# Patient Record
Sex: Male | Born: 1935 | Race: White | Hispanic: No | Marital: Married
Health system: Southern US, Community
[De-identification: ages and names within clinical notes are randomized; demographics above are authoritative.]

## PROBLEM LIST (undated history)

## (undated) DIAGNOSIS — Z950 Presence of cardiac pacemaker: Secondary | ICD-10-CM

## (undated) DIAGNOSIS — Z9289 Personal history of other medical treatment: Secondary | ICD-10-CM

## (undated) DIAGNOSIS — Z9889 Other specified postprocedural states: Secondary | ICD-10-CM

## (undated) HISTORY — DX: Presence of cardiac pacemaker: Z95.0

## (undated) HISTORY — DX: Personal history of other medical treatment: Z92.89

## (undated) HISTORY — DX: Other specified postprocedural states: Z98.890

---

## 1998-07-19 ENCOUNTER — Inpatient Hospital Stay (HOSPITAL_COMMUNITY): Admission: RE | Admit: 1998-07-19 | Discharge: 1998-07-21 | Payer: Self-pay | Admitting: *Deleted

## 1998-10-08 ENCOUNTER — Inpatient Hospital Stay (HOSPITAL_COMMUNITY): Admission: RE | Admit: 1998-10-08 | Discharge: 1998-10-09 | Payer: Self-pay | Admitting: Cardiovascular Disease

## 2004-11-03 ENCOUNTER — Inpatient Hospital Stay (HOSPITAL_COMMUNITY): Admission: AD | Admit: 2004-11-03 | Discharge: 2004-11-12 | Payer: Self-pay | Admitting: Cardiovascular Disease

## 2004-11-04 DIAGNOSIS — Z9889 Other specified postprocedural states: Secondary | ICD-10-CM

## 2004-11-04 HISTORY — DX: Other specified postprocedural states: Z98.890

## 2004-11-26 ENCOUNTER — Inpatient Hospital Stay (HOSPITAL_COMMUNITY): Admission: EM | Admit: 2004-11-26 | Discharge: 2004-12-09 | Payer: Self-pay | Admitting: Emergency Medicine

## 2005-01-04 DIAGNOSIS — Z9289 Personal history of other medical treatment: Secondary | ICD-10-CM

## 2005-01-04 HISTORY — DX: Personal history of other medical treatment: Z92.89

## 2005-01-17 ENCOUNTER — Inpatient Hospital Stay (HOSPITAL_COMMUNITY): Admission: EM | Admit: 2005-01-17 | Discharge: 2005-01-20 | Payer: Self-pay | Admitting: *Deleted

## 2005-10-18 ENCOUNTER — Ambulatory Visit (HOSPITAL_COMMUNITY): Admission: RE | Admit: 2005-10-18 | Discharge: 2005-10-18 | Payer: Self-pay | Admitting: *Deleted

## 2007-06-20 ENCOUNTER — Inpatient Hospital Stay (HOSPITAL_COMMUNITY): Admission: EM | Admit: 2007-06-20 | Discharge: 2007-06-28 | Payer: Self-pay | Admitting: Emergency Medicine

## 2007-07-05 ENCOUNTER — Ambulatory Visit: Payer: Self-pay | Admitting: Internal Medicine

## 2009-01-09 ENCOUNTER — Inpatient Hospital Stay (HOSPITAL_COMMUNITY): Admission: EM | Admit: 2009-01-09 | Discharge: 2009-01-15 | Payer: Self-pay | Admitting: Cardiology

## 2010-07-03 IMAGING — CR DG CHEST 2V
2 series · 2 of 2 positions shown · non-contrast
Comparison: 06/25/2007 chest radiographs.

CLINICAL DATA: Chest wall abscess.  Diabetes.

CHEST - 2 VIEW

[w chest pa]
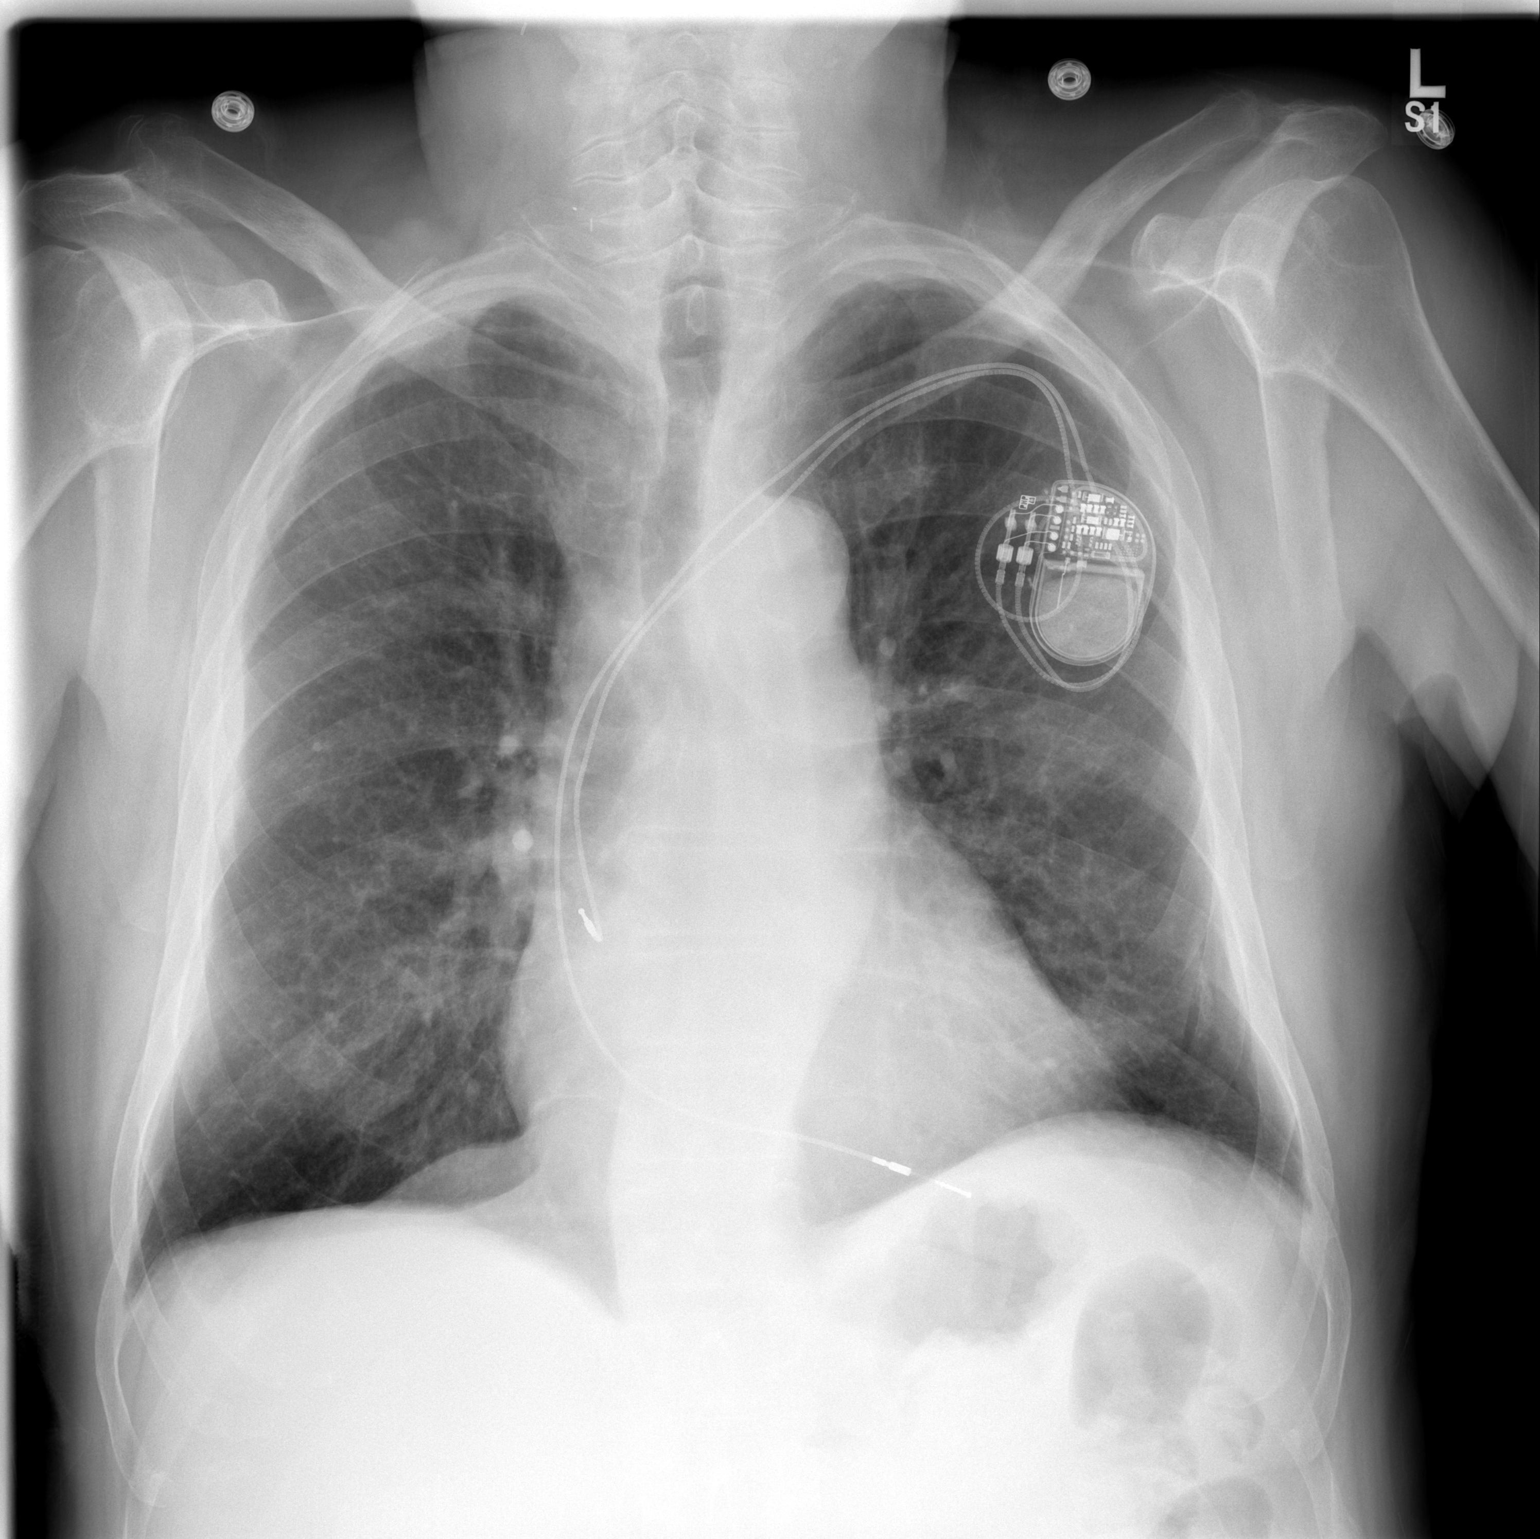

[w chest lat]
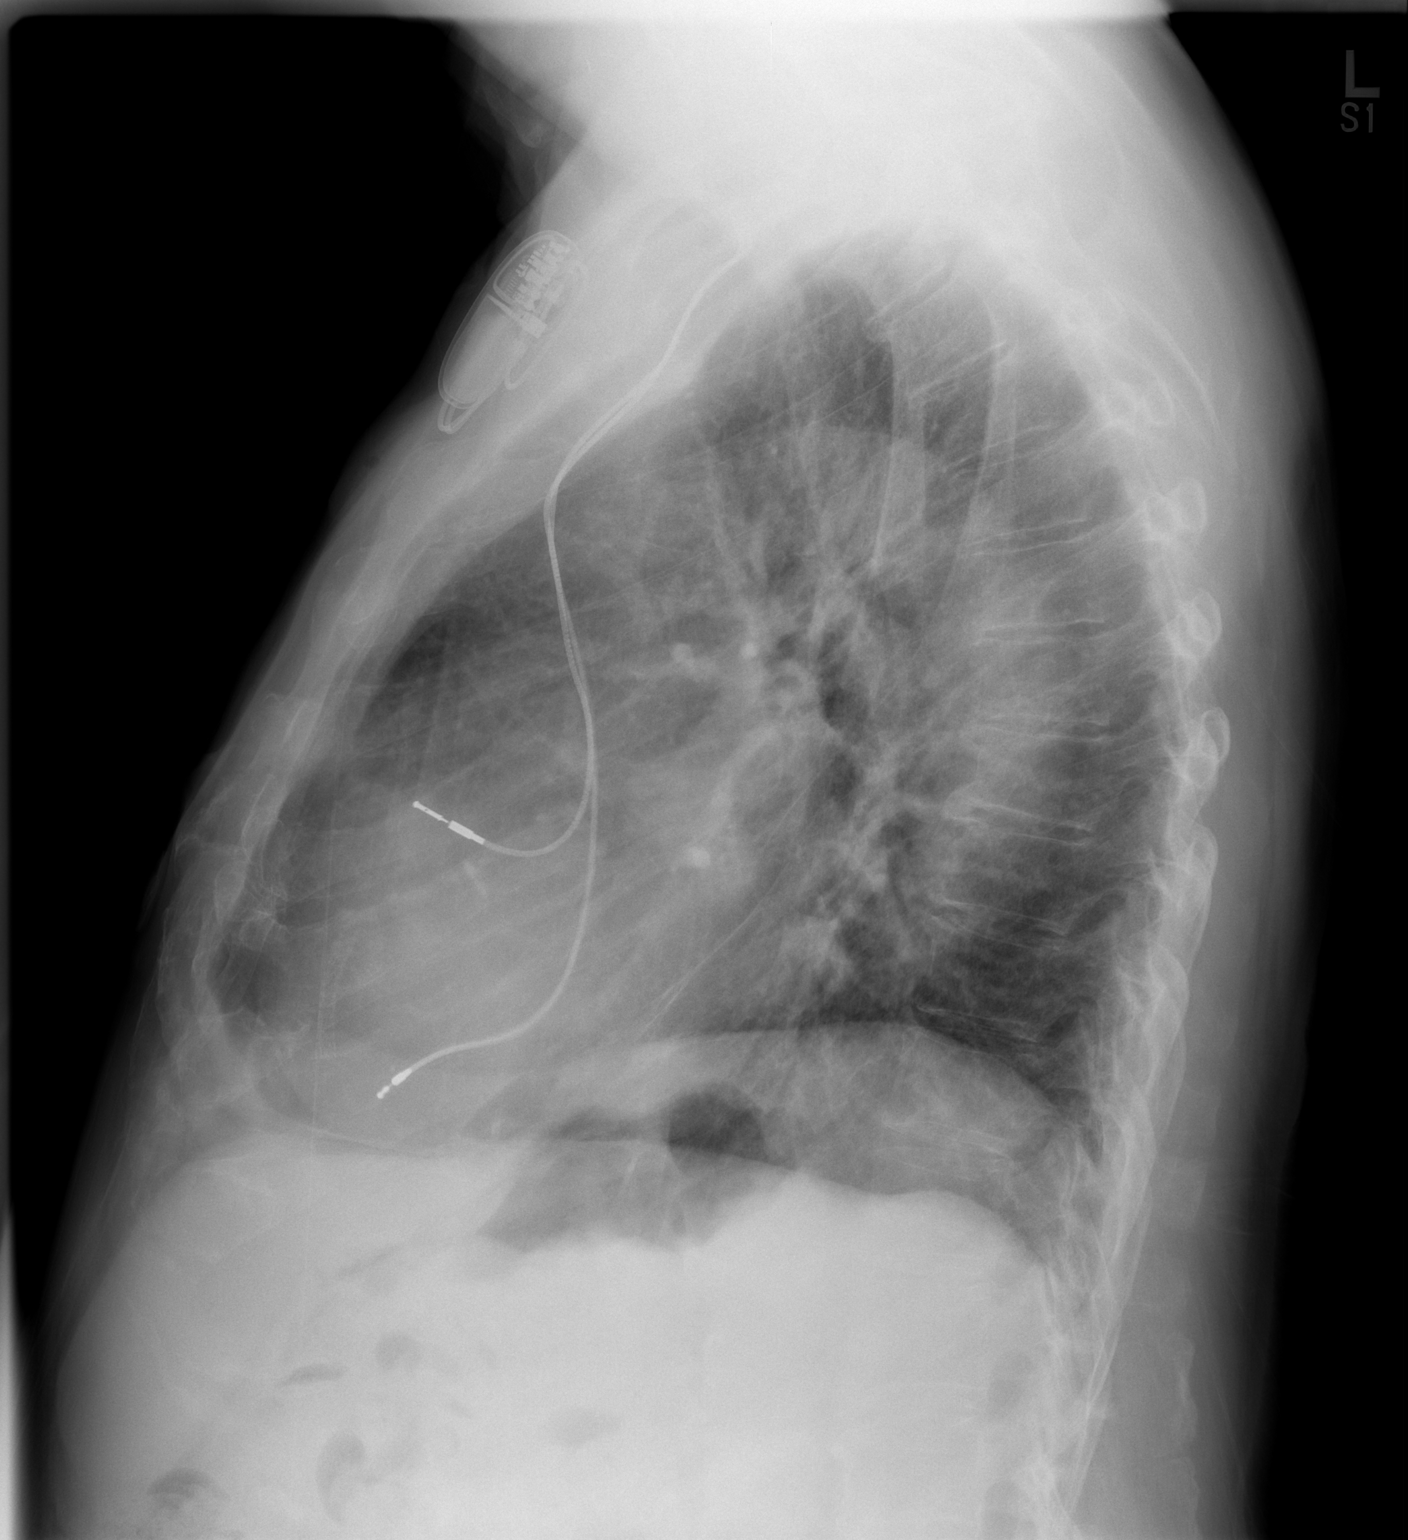

[2 of 2 positions shown; findings below may reference images not displayed]

FINDINGS: The heart size and mediastinal contours are stable.
There is mild aortic tortuosity.  A left subclavian AV sequential
pacemaker appears stable.  The ventricular lead appears partially
severed near the generator, but no discontinuity is identified.
The lungs are clear.  There is no pleural effusion or pneumothorax.
IMPRESSION: 1.  The ventricular lead of the patient's pacemaker appears
partially severed near the generator.
2.  No acute cardiopulmonary process demonstrated.
[DATE] be helpful to evaluate for chest wall abscess if
clinically warranted.

## 2011-04-10 LAB — COMPREHENSIVE METABOLIC PANEL
ALT: 15 U/L (ref 0–53)
AST: 24 U/L (ref 0–37)
Albumin: 3.4 g/dL — ABNORMAL LOW (ref 3.5–5.2)
Alkaline Phosphatase: 40 U/L (ref 39–117)
BUN: 23 mg/dL (ref 6–23)
CO2: 25 mEq/L (ref 19–32)
Calcium: 8.6 mg/dL (ref 8.4–10.5)
Chloride: 95 mEq/L — ABNORMAL LOW (ref 96–112)
Creatinine, Ser: 1.5 mg/dL (ref 0.4–1.5)
GFR calc Af Amer: 54 mL/min — ABNORMAL LOW (ref >60)
GFR calc non Af Amer: 45 mL/min — ABNORMAL LOW (ref >60)
Glucose, Bld: 331 mg/dL — ABNORMAL HIGH (ref 70–99)
Potassium: 3.9 mEq/L (ref 3.5–5.1)
Sodium: 131 mEq/L — ABNORMAL LOW (ref 135–145)
Total Bilirubin: 0.7 mg/dL (ref 0.3–1.2)
Total Protein: 6.9 g/dL (ref 6.0–8.3)

## 2011-04-10 LAB — BASIC METABOLIC PANEL
BUN: 23 mg/dL (ref 6–23)
CO2: 27 mEq/L (ref 19–32)
CO2: 29 mEq/L (ref 19–32)
CO2: 29 mEq/L (ref 19–32)
Calcium: 9 mg/dL (ref 8.4–10.5)
Calcium: 9.2 mg/dL (ref 8.4–10.5)
Chloride: 94 mEq/L — ABNORMAL LOW (ref 96–112)
Chloride: 97 mEq/L (ref 96–112)
Chloride: 98 mEq/L (ref 96–112)
Creatinine, Ser: 1.52 mg/dL — ABNORMAL HIGH (ref 0.4–1.5)
Creatinine, Ser: 1.59 mg/dL — ABNORMAL HIGH (ref 0.4–1.5)
GFR calc Af Amer: 50 mL/min — ABNORMAL LOW (ref >60)
GFR calc Af Amer: 51 mL/min — ABNORMAL LOW (ref >60)
GFR calc Af Amer: >60 mL/min (ref >60)
GFR calc non Af Amer: 42 mL/min — ABNORMAL LOW (ref >60)
GFR calc non Af Amer: 44 mL/min — ABNORMAL LOW (ref >60)
Glucose, Bld: 238 mg/dL — ABNORMAL HIGH (ref 70–99)
Glucose, Bld: 248 mg/dL — ABNORMAL HIGH (ref 70–99)
Potassium: 4 mEq/L (ref 3.5–5.1)
Sodium: 136 mEq/L (ref 135–145)
Sodium: 136 mEq/L (ref 135–145)
Sodium: 136 mEq/L (ref 135–145)

## 2011-04-10 LAB — CULTURE, ROUTINE-ABSCESS: Culture: NO GROWTH

## 2011-04-10 LAB — CBC
HCT: 29.8 % — ABNORMAL LOW (ref 39.0–52.0)
HCT: 30.1 % — ABNORMAL LOW (ref 39.0–52.0)
HCT: 32 % — ABNORMAL LOW (ref 39.0–52.0)
Hemoglobin: 10.6 g/dL — ABNORMAL LOW (ref 13.0–17.0)
Hemoglobin: 9.9 g/dL — ABNORMAL LOW (ref 13.0–17.0)
Hemoglobin: 9.9 g/dL — ABNORMAL LOW (ref 13.0–17.0)
MCHC: 33 g/dL (ref 30.0–36.0)
MCHC: 33 g/dL (ref 30.0–36.0)
MCHC: 33 g/dL (ref 30.0–36.0)
MCHC: 33.8 g/dL (ref 30.0–36.0)
MCV: 90.7 fL (ref 78.0–100.0)
MCV: 91 fL (ref 78.0–100.0)
MCV: 91.4 fL (ref 78.0–100.0)
MCV: 91.7 fL (ref 78.0–100.0)
MCV: 91.9 fL (ref 78.0–100.0)
MCV: 92.3 fL (ref 78.0–100.0)
Platelets: 258 10*3/uL (ref 150–400)
Platelets: 260 10*3/uL (ref 150–400)
Platelets: 266 10*3/uL (ref 150–400)
Platelets: 274 10*3/uL (ref 150–400)
Platelets: 288 10*3/uL (ref 150–400)
RBC: 3.25 MIL/uL — ABNORMAL LOW (ref 4.22–5.81)
RBC: 3.27 MIL/uL — ABNORMAL LOW (ref 4.22–5.81)
RBC: 3.4 MIL/uL — ABNORMAL LOW (ref 4.22–5.81)
RBC: 3.49 MIL/uL — ABNORMAL LOW (ref 4.22–5.81)
RDW: 14.7 % (ref 11.5–15.5)
RDW: 14.8 % (ref 11.5–15.5)
RDW: 14.9 % (ref 11.5–15.5)
RDW: 14.9 % (ref 11.5–15.5)
RDW: 15.2 % (ref 11.5–15.5)
WBC: 7.4 10*3/uL (ref 4.0–10.5)
WBC: 7.5 10*3/uL (ref 4.0–10.5)
WBC: 7.5 10*3/uL (ref 4.0–10.5)
WBC: 7.6 10*3/uL (ref 4.0–10.5)
WBC: 9.1 10*3/uL (ref 4.0–10.5)

## 2011-04-10 LAB — GLUCOSE, CAPILLARY
Glucose-Capillary: 169 mg/dL — ABNORMAL HIGH (ref 70–99)
Glucose-Capillary: 195 mg/dL — ABNORMAL HIGH (ref 70–99)
Glucose-Capillary: 208 mg/dL — ABNORMAL HIGH (ref 70–99)
Glucose-Capillary: 224 mg/dL — ABNORMAL HIGH (ref 70–99)
Glucose-Capillary: 231 mg/dL — ABNORMAL HIGH (ref 70–99)
Glucose-Capillary: 283 mg/dL — ABNORMAL HIGH (ref 70–99)
Glucose-Capillary: 311 mg/dL — ABNORMAL HIGH (ref 70–99)
Glucose-Capillary: 314 mg/dL — ABNORMAL HIGH (ref 70–99)
Glucose-Capillary: 338 mg/dL — ABNORMAL HIGH (ref 70–99)
Glucose-Capillary: 358 mg/dL — ABNORMAL HIGH (ref 70–99)
Glucose-Capillary: 370 mg/dL — ABNORMAL HIGH (ref 70–99)

## 2011-04-10 LAB — URINALYSIS, ROUTINE W REFLEX MICROSCOPIC
Bilirubin Urine: NEGATIVE
Glucose, UA: >1000 mg/dL — AB
Hgb urine dipstick: NEGATIVE
Ketones, ur: NEGATIVE mg/dL
Leukocytes, UA: NEGATIVE
Nitrite: NEGATIVE
Protein, ur: NEGATIVE mg/dL
Specific Gravity, Urine: 1.021 (ref 1.005–1.030)
Urobilinogen, UA: 1 mg/dL (ref 0.0–1.0)
pH: 6.5 (ref 5.0–8.0)

## 2011-04-10 LAB — DIFFERENTIAL
Basophils Absolute: 0 10*3/uL (ref 0.0–0.1)
Basophils Relative: 0 % (ref 0–1)
Eosinophils Absolute: 0.1 10*3/uL (ref 0.0–0.7)
Eosinophils Relative: 1 % (ref 0–5)
Lymphocytes Relative: 10 % — ABNORMAL LOW (ref 12–46)
Lymphs Abs: 0.9 10*3/uL (ref 0.7–4.0)
Monocytes Absolute: 0.8 10*3/uL (ref 0.1–1.0)
Monocytes Relative: 9 % (ref 3–12)
Neutro Abs: 7.2 10*3/uL (ref 1.7–7.7)
Neutrophils Relative %: 80 % — ABNORMAL HIGH (ref 43–77)

## 2011-04-10 LAB — CULTURE, BLOOD (ROUTINE X 2)
Culture: NO GROWTH
Culture: NO GROWTH

## 2011-04-10 LAB — PROTIME-INR
INR: 1.3 (ref 0.00–1.49)
INR: 1.5 (ref 0.00–1.49)
INR: 1.6 — ABNORMAL HIGH (ref 0.00–1.49)
INR: 1.6 — ABNORMAL HIGH (ref 0.00–1.49)
Prothrombin Time: 17.1 seconds — ABNORMAL HIGH (ref 11.6–15.2)
Prothrombin Time: 17.3 seconds — ABNORMAL HIGH (ref 11.6–15.2)
Prothrombin Time: 18.3 seconds — ABNORMAL HIGH (ref 11.6–15.2)
Prothrombin Time: 19.5 seconds — ABNORMAL HIGH (ref 11.6–15.2)
Prothrombin Time: 19.6 seconds — ABNORMAL HIGH (ref 11.6–15.2)

## 2011-04-10 LAB — URINE MICROSCOPIC-ADD ON

## 2011-04-10 LAB — HEPARIN LEVEL (UNFRACTIONATED)
Heparin Unfractionated: 0.2 IU/mL — ABNORMAL LOW (ref 0.30–0.70)
Heparin Unfractionated: 0.3 IU/mL (ref 0.30–0.70)

## 2011-04-10 LAB — FOLATE RBC: RBC Folate: 2267 ng/mL — ABNORMAL HIGH (ref 180–600)

## 2011-04-10 LAB — HEMOGLOBIN A1C
Hgb A1c MFr Bld: 9.2 % — ABNORMAL HIGH (ref 4.6–6.1)
Mean Plasma Glucose: 217 mg/dL

## 2011-04-10 LAB — TSH: TSH: 2.484 u[IU]/mL (ref 0.350–4.500)

## 2011-05-09 NOTE — Discharge Summary (Signed)
NAME:  Matthew Lloyd, Matthew Lloyd NO.:  [id number]   MEDICAL RECORD NO.:  [id number]          PATIENT TYPE:  INP   LOCATION:  2022                         FACILITY:  [facility]   PHYSICIAN:  Madaline Savage, M.D.DATE OF BIRTH:  1926/08/15   DATE OF ADMISSION:  01/09/2009  DATE OF DISCHARGE:  01/15/2009                               DISCHARGE SUMMARY   DISCHARGE DIAGNOSES:  1. Infected sebaceous cyst, left chest, status post incision and      drainage as an outpatient at Urgent Care with a drain placement.  2. History of a permanent pacemaker implant, January 2006 for      paroxysmal atrial fibrillation and bradycardia.  3. Insulin-dependent diabetes.  4. Coumadin therapy, Lovenox to Coumadin crossover at discharge.  5. Coronary artery disease with right coronary artery Cypher stenting      in 2005.  6. Peripheral vascular disease with prior right iliac artery      percutaneous transluminal angioplasty and prior carotid      endarterectomy.  7. History of anemia, the patient did have a transfusion in July 2008.      He has had a Gastrointestinal workup in December 2005 and again in      July 2008.  8. Gastroesophageal reflux and hiatal hernia.  9. Chronic obstructive pulmonary disease.   HOSPITAL COURSE:  The patient is an 77 year old male followed by Dr.  Lucila Maine in [place].  His cardiologist is Dr. Allyson Sabal.  He has a  pacemaker implanted by Dr. Jenne Campus in January 2006.  He has been on  chronic Coumadin.  His pacemaker is a Medtronic device.  Apparently as  an outpatient he developed a blackhead on his left chest wall.  The  patient tried to take care of this himself.  His wife noted a few days  prior to admission that he had a large boil at the site.  She took him  to an Urgent Care where this was opened and a drain placed.  It was then  noted that the site was about an inch and half below his pacemaker.  He  was transferred to [facility] for further treatment.  He was  admitted by Dr.  Elsie Lincoln.  We put him on IV antibiotics.  His initial chest x-ray  questioned possible lead fracture, but his pacemaker was interrogated  and did not show a lead fracture and that the patient was functioning  normally.  The patient was seen in consult by the Surgical Service, Dr.  Abbey Chatters.  Dr. Abbey Chatters feels that the patient most likely had an  infected sebaceous cyst.  He did a wider I and D and debridement at the  bedside.  He feels the patient should be continued with b.i.d. dressing  changes at home.  His Coumadin had been held on admission and then the  patient was put on heparin.  Blood cultures and wound culture showed no  growth.  Lovenox was added.  We feel he can be crossed over as an  outpatient to Coumadin.  On January 15, 2009, his INR was 1.4.  We will  given him an extra dose of Lovenox today and have him follow up Monday  in the office with a protime and a wound site check.  I discussed all  this with the patient's wife and she understands.  She will be giving  his injections.  Home health nurse will be doing the wound changes.   DISCHARGE MEDICATIONS:  1. TriCor 145 daily.  2. Lantus 40 units at bedtime.  3. Protonix 40 mg a day.  4. Lasix 40 mg twice a day.  5. Metformin 500 mg b.i.d.  6. Coreg 25 mg b.i.d.  7. Aspirin 81 mg a day.  8. Colace 100 mg a day.  9. Multivitamin daily.  10.Digoxin 0.125 mg a day.  11.Flomax 0.4 mg a day.  12.Finasteride 5 mg daily.  13.Coumadin 6 mg a day with 3 mg on Mondays and Fridays.  14.Lipitor 20 mg a day.  15.Keflex 500 mg t.i.d.  16.Lovenox 80 mg subcu q.12.   LABORATORIES AT DISCHARGE:  White count 7.9, hemoglobin 10.4, hematocrit  30.8, and platelets 302.  INR 1.4.  Sodium 134, potassium 3.8, BUN 25,  creatinine and 1.59.  Hemoglobin A1c is 9.2 on admission.  Folate 2267  and B12 of 326.  Blood cultures showed no growth x5 days.  Wound culture  shows no growth.  Chest x-ray 2-view shows ventricular  lead.  The  patient appears to be partially severed near the generator, no acute  process, pacemaker check indicated.  The pacemaker was functioning  normally and that there was no lead fracture.   DISPOSITION:  The patient is discharged in stable condition.  He will  have a protime and a site check on Monday.  He probably should have his  wound check every 3 days or so.  He will follow up with Dr. Abbey Chatters  in a couple weeks.  Home health nurse has been arranged to give the  patient b.i.d. dressing changes and repacking.      Abelino Derrick, P.A.    ______________________________  Madaline Savage, M.D.    Lenard Lance  D:  01/15/2009  T:  01/16/2009  Job:  [id number]   cc:   Madaline Savage, M.D.

## 2011-05-09 NOTE — H&P (Signed)
NAME:  Matthew Lloyd, Matthew Lloyd NO.:  [id number]   MEDICAL RECORD NO.:  [id number]          PATIENT TYPE:  INP   LOCATION:  2022                         FACILITY:  [facility]   PHYSICIAN:  Madaline Savage, M.D.DATE OF BIRTH:  1926-05-04   DATE OF ADMISSION:  01/09/2009  DATE OF DISCHARGE:                              HISTORY & PHYSICAL   HISTORY OF PRESENT ILLNESS:  Mr. Matthew Lloyd is an 77 year old male followed  by Dr. Lucila Maine in [place].  His cardiologist is Dr. Allyson Sabal.  He has  a history of remote peripheral vascular disease with prior right iliac  artery PTA.  He also has coronary disease and had an RCA Cypher stent  placed in 2005.  At that time he had a residual 70% circumflex stenosis  that has been treated medically.  His last ejection fraction was 45-50%.  He has PAF and sick sinus syndrome and had a Medtronic pacemaker  implanted by Dr. Jenne Campus in January 2006.  He is on chronic Coumadin.  He is followed at our clinic.  He had a pacer check a couple weeks ago  and was told this was okay.  The patient says a few weeks ago he  developed a blackhead  just below his pacemaker site.  He tried to pop  it.  This gradually became more inflamed until he had apparently a fair  sized abscess at the site.  His wife was unaware of this until couple  days ago.  She brought him to Urgent Care today.  At Urgent Care this  site was looked at and I and D's and a drain put in and the patient is  now admitted for further treatment here to [facility].  The patient  denies any systemic complaints such as fever or chills.  He has  otherwise been doing well.   CURRENT MEDICATIONS:  1. Coumadin 3 mg on Monday and Friday and 6 mg other days.  2. Protonix 40 mg a day.  3. Lasix 40 mg p.o. b.i.d.  4. Metformin 500 mg b.i.d.  5. Coreg 25 mg b.i.d.  6. Aspirin 81 mg a day.  7. Multivitamin daily.  8. Tricor 145 daily.  9. Lantus 30 units q.p.m.  10.Lipitor 20 mg a day.   ALLERGIES:   Denies any drug allergies.   SOCIAL HISTORY:  He is married.  He is a nonsmoker.  He lives with his  wife.   FAMILY HISTORY:  Remarkable as father died of heart failure  complications at 5.  His mother died at 4.   PAST MEDICAL HISTORY:  1. His past medical history is otherwise remarkable for insulin-      dependent diabetes.  2. He also has a history of BPH.  3. He has treated dyslipidemia.  4. He has a history of chronic anemia and was evaluated in 2005 with      an endoscopy and colonoscopy and again was noted to have low B12      levels and high MCV in July 2008.  5. During that admission, he was admitted for hypotension.  He was      seen by Dr. Marina Goodell in July 2008.  6. He has a history of gastroesophageal reflux disease.  7. History of hiatal hernia.  8. COPD.   REVIEW OF SYSTEMS:  Essentially unremarkable except for noted above.  He  denies any he has had melena.  He denies any fever or chills.  He is  hard of hearing and has hearing aid.   PHYSICAL EXAMINATION:  VITAL SIGNS:  Pulse is 70, respirations 12, blood  pressure will 110/74.  GENERAL:  He is a well-developed, well-nourished male in no acute  distress.  HEENT:  Normocephalic. He has bilateral hearing aids.  He has poor  dentition.  NECK:  Without JVD or bruit.  CHEST:  Clear to auscultation and percussion.  Anterior chest wall shows  an erythematous area below his pacemaker site.  He has a wick extending  from an open wound that is covered with a 4x4.  CARDIAC:  Regular rate and rhythm without obvious murmur or rub.  ABDOMEN:  Obese.  She has a ventral hernia that is nontender.  EXTREMITIES:  Revealed no edema and distal pulses are diminished  bilaterally.  I could ascertain no femoral bruits.  NEURO:  Grossly intact.  He is awake, alert, oriented and cooperative.   LABORATORY DATA:  Labs and EKG are pending.   IMPRESSION:  1. Superficial infection, incised and drained.  The site is just below      his  pacemaker.  2. History of Medtronic pacemaker implant January 2006 for paroxysmal      atrial fibrillation and bradycardia.  3. Treated hypertension.  4. Insulin-dependent diabetes.  5. Coronary disease with right coronary artery Cypher stent in 2005      with residual 70% circumflex.  6. Peripheral vascular disease with prior right iliac artery      angioplasty and history of prior carotid endarterectomy.  7. Coumadin therapy.  8. History of anemia.  The patient was transfused in July 2008.  He      had a gastrointestinal workup in December 2005 and was seen again      and noted to be B12 deficient July 2008.  9. Esophageal reflux and hiatal hernia.  10.Chronic obstructive pulmonary disease.   PLAN:  The patient is to be admitted to telemetry.  Will start him on IV  Ancef.  We will go ahead and check cultures including a wound culture  and blood cultures.      Abelino Derrick, P.A.    ______________________________  Madaline Savage, M.D.    Lenard Lance  D:  01/09/2009  T:  01/09/2009  Job:  [id number]

## 2011-05-09 NOTE — Discharge Summary (Signed)
NAME:  Matthew Lloyd, Matthew Lloyd NO.:  [id number]   MEDICAL RECORD NO.:  [id number]          PATIENT TYPE:  INP   LOCATION:  4715                         FACILITY:  [facility]   PHYSICIAN:  Madaline Savage, M.D.DATE OF BIRTH:  Jan 02, 1926   DATE OF ADMISSION:  06/20/2007  DATE OF DISCHARGE:  06/28/2007                               DISCHARGE SUMMARY   Mr. Gaelan Glennon is a 77 year old male patient of Dr. Nanetta Batty  who came to the hospital with hypotension.  He was seen by Dr. Julieanne Manson.  Multiple medications were held and his Lopressor was decreased.  He was also found to be Dig toxic.  His BNP was in the 700s.  His blood  pressure was low so his diuretic was held.  His Coumadin also was  initially held.  Dig level the following day, June 21, 2007, was 3.8.  His medications continued to be held.  On June 21, 2007, he had  increased confusion.  His blood pressure dropped.  He was started on IV  dopamine for blood pressure of 76/38.  He was moved to the unit. On June 22, 2007, his IV dopamine was weaned.  It was found that he was vitamin  B12 deficient.  He was also anemic.  He had a high MCV.  His replacement  with vitamin B12 was started on June 23, 2007.  He received 1000 mcg a  day for 7 days and will need further loading at his physician's office  of 1000 mcg once a week and then once a month.  His hemoglobin did drop.  His guaiac stool was negative.  GI consult was called.  He received 1  unit of packed red blood cells on June 27, 2007, for hemoglobin of 7.4.  It then increased to 9.3 and the following day it dropped down to 8.3.  His admitting hemoglobin was 8.9.  He was seen by Dr. Yancey Flemings who  recommended continued B12 replacement and that he should be on proton  pump inhibitor prophylactically because of his age and because of his  need for anticoagulation.  No further GI tests were thought needed at  this time.  On June 28, 2007, he was seen by Dr. Elsie Lincoln  and considered  stable to be discharged home.  The patient apparently really wanted to  go home.  His wife thought that she could handle him.  He was seen by  cardiac rehab and walked.  He does use a walker at home and he is fairly  sedentary apparently.  We did order home health and PT evaluation for  home at his discharge.   DISCHARGE MEDICATIONS:  1. Proscar 5 mg a day.  2. Avandia 8 mg a day.  3. Tricor 145 mg a day.  4. Flomax 0.4 mg a day.  5. Lipitor 20 mg a day.  6. Lantus insulin 5 units at bedtime or his sliding scale as usual.  7. Metoprolol 25 mg twice a day.  8. Lasix 40 mg a day.  9. Colace 200 mg a day.  10.Protonix  40 mg a day or over-the-counter Prilosec 20 mg a day.  11.Coumadin 1 mg a day.  12.Aspirin 81 mg a day.  13.Vitamin B12 injections 1000 mcg weekly x1 month and then 1000 mcg a      day of vitamin B12 daily.  14.Fortamet 1 gram at bedtime.  15.He should stop his Digoxin, Cartia XT, and his Altace.   Also to note, he apparently drinks wine.  He was told that he could only  have 1 glass of wine a night.  This may be a problem that will need to  be assessed as an outpatient.  It was recommended that he checked his  prothrombin time and a CBC on Tuesday.  He should see  Dr. Lorin Picket this  coming week and see Dr. Allyson Sabal in 1 to 2 weeks.  He will need close  followup.  If his hemoglobin drops again, the risk-benefit ratio may  have worsened now to the point that he is not a Coumadin candidate.  This will need to be assessed as an outpatient.  His Plavix was stopped.  He had a Cypher stent placed in 2005.   LABS:  His labs on June 28, 2007, revealed BNP was 783.  Sodium was 136,  potassium 3.9, chloride 105, CO2 28, glucose 110, BUN 31, and  creatinine was 1.18.  INR was 1.3.  CBC showed a hemoglobin of 8.9,  hematocrit 25.4, WBC 5.2 and platelets 214.  BNP on June 25, 2007,  was  1097.  Dig level on June 24, 2007, was 1.  Total binding iron was 256,  iron was 54,  and folate was 2.1.  Vitamin B12 was only 128.  CEA was  1.3.  Retic percent was 1.1.  Hemoglobin A1c was 5.7.  Alcohol level on  admission was less than 5.  Dig level on admission was 4.8.   DISCHARGE DIAGNOSES:  1. Hypotension.  2. Dig toxicity.  3. Anemia.  Vitamin B12 deficient, now getting replacement.  4. Paroxysmal atrial fibrillation.  5. __________.  6. Insulin-dependent diabetes.  7. Sedentary.  He uses a walker at home.  8. Ejection fraction of 45% to 50%.  9. Benign prostatic hypertrophy.  10.Dyslipidemia.  11.Coronary artery disease with history of Cypher stent in 2005 to his      right coronary artery with residual disease in the circumflex of      70%.      Lezlie Octave, N.P.    ______________________________  Madaline Savage, M.D.    BB/MEDQ  D:  06/28/2007  T:  06/28/2007  Job:  [id number]   cc:   Lucila Maine, MD

## 2011-05-09 NOTE — Op Note (Signed)
NAME:  COE, ANGELOS NO.:  [id number]   MEDICAL RECORD NO.:  [id number]          PATIENT TYPE:  INP   LOCATION:  2022                         FACILITY:  [facility]   PHYSICIAN:  Adolph Pollack, M.D.DATE OF BIRTH:  [birth year]   DATE OF PROCEDURE:  01/11/2009  DATE OF DISCHARGE:                               OPERATIVE REPORT   PREOPERATIVE DIAGNOSIS:  Incompletely drained left chest wall abscess.   POSTOPERATIVE DIAGNOSIS:  Incompletely drained left chest wall abscess.   PROCEDURE:  Incision, drainage, and debridement of left chest wall  abscess.   SURGEON:  Adolph Pollack, MD   ANESTHESIA:  Lidocaine, local.   TECHNIQUE:  The area in the left chest wall which was inferior to the  pacemaker site was sterilely prepped and draped and anesthetized with  lidocaine solution.  An elliptical incision was made debriding some of  the dead skin over the area.  Purulent debris which appeared to be  consistent with sebaceous cyst content was removed as well as some of  the cystic capsule.  This all was taken down to good bleeding tissue.  The wound was then packed and dressed.  He tolerated the procedure well.      Adolph Pollack, M.D.  Electronically Signed     TJR/MEDQ  D:  01/11/2009  T:  01/12/2009  Job:  [id number]

## 2011-05-09 NOTE — Consult Note (Signed)
NAME:  Matthew Lloyd, UREY NO.:  [id number]   MEDICAL RECORD NO.:  [id number]          PATIENT TYPE:  INP   LOCATION:  2022                         FACILITY:  [facility]   PHYSICIAN:  Adolph Pollack, M.D.DATE OF BIRTH:  06-26-1926   DATE OF CONSULTATION:  01/11/2009  DATE OF DISCHARGE:                                 CONSULTATION   TIME OF CONSULTATION:  14:10 p.m.   REQUESTING PHYSICIAN:  Dr. Gaspar Garbe B. Little.   PRIMARY CARE PHYSICIAN:  Dr. Lucila Maine in [place].   CARDIOLOGIST:  Dr. Nanetta Batty.   REASON FOR CONSULTATION:  Abscess on chest just below pacemaker.   HISTORY OF PRESENT ILLNESS:  Mr. Bink is an 77 year old white male who  has multiple medical problems including paroxysmal atrial fibrillation  and bradycardia, which resulted in the placement of a Medtronic  pacemaker in January 2006, insulin-dependent diabetes, coronary artery  disease, COPD, peripheral vascular disease, who noticed a blackhead,  which started appearing on his chest approximately 1 month ago.  He  states at this time he squeezed it several times, however, it just  continued to worsen.  At this time, the patient did not mention this to  his wife until his wife actually noticed it on his chest on Saturday.  At this time, she was highly concerned due to the proximity to the  patient's pacemaker.  Only at that time took the patient to an Urgent  Care Clinic.  At the clinic, an I&D of this abscess was performed.  This  area was packed with a wick.  At that time, the patient was sent over  here to [facility] to check with his cardiologist to make sure that this  abscess was not interfering with his pacemaker.  At that time, the  patient was admitted to the hospital for observation.  He denies any  fevers or chills.  At this time, he does not have white blood cell  count.  After this area was I&D'd and packed in place, it has not been  changed since then.  Also, since then, the  patient has not had any  problems with his cardiac rhythm or any other signs of pacemaker  malfunction.  However, due to the proximity of this abscess to the  pacemaker, we were called to check and make sure that no further  treatment of this abscess was needed, and to make sure that the  pacemaker was not involved.   REVIEW OF SYSTEMS:  Please see HPI, otherwise all other systems are  currently negative.   PAST MEDICAL HISTORY:  Significant for:  1. Coronary artery disease.  2. Peripheral vascular disease.  3. Paroxysmal atrial fibrillation for which he has a pacemaker.  4. History of BPH.  5. Dyslipidemia.  6. History of chronic anemia.  7. Hypertension.  8. Chronic obstructive pulmonary disease.   PAST SURGICAL HISTORY:  1. Placement of pacemaker in 2006.  2. Cypher stent placed in 2005.  It does not appear that he has had      any surgery on his abdomen.   SOCIAL  HISTORY:  The patient is married and currently lives with his  wife.  He has 1 daughter.  He quit smoking approximately 30 years ago.  However, he currently still drinks alcohol.  He states that the other  day, just within a day and a half to 2 days, he drank approximately  seven 16-ounce beer.   ALLERGIES:  NKDA.   MEDICATIONS:  1. Coumadin 3 mg on Monday and Friday, 6 mg on other days.  2. Protonix 40 mg a day.  3. Lasix 40 mg p.o. b.i.d.  4. Metformin 500 mg b.i.d.  5. Coreg 25 mg b.i.d.  6. Aspirin 81 mg a day.  7. Multivitamin daily.  8. TriCor 145 mg daily.  9. Lantus 30 units q.p.m.  10.Lipitor 20 mg a day.   PHYSICAL EXAMINATION:  GENERAL:  Mr. Wolman is an 77 year old white male  who was very pleasant, well developed, well nourished, and currently, in  no acute distress.  VITAL SIGNS:  Temperature 97.4, pulse 70, respirations 18, and blood  pressure was 110/58.  HEENT:  Head is normocephalic and atraumatic.  Sclerae noninjected.  Pupils are equal, round, and reactive to light.  It is of note  that the  patient has 1 brown eye and 1 blue eye.  Ears and nose without any  obvious masses or lesions.  No rhinorrhea.  Mouth is pink and moist.  Throat shows no exudate.  NECK:  Supple.  Trachea is midline.  No thyromegaly.  HEART:  Currently paced and has regular rate and rhythm.  No murmurs,  gallops, or rubs are noted.  The patient has good radial, pedal, and  carotid pulses bilaterally.  LUNGS:  Clear to auscultation bilaterally.  No wheezes, rhonchi, or  rales noted.  Respiratory effort is nonlabored.  Chest is symmetrical,  however, he does have a sebaceous cyst noted on his left chest just  inferior to his pacemaker.  He has approximately 3 cm extending outward  of surrounding erythema.  He also has some noted induration around the  opening of this wound.  This wound has been I&D'd and the opening is  approximately 0.5 x 0.5 cm caliber.  Currently, the opening is small  enough and due to the proximity of the pacemaker, we did not want to  explore this cavity to determine its depth until we can better see.  Otherwise, when palpating this area, a solid sebaceous cyst type  drainage was able to be expressed along with some liquid purulent  material.  This was not malodorous.  ABDOMEN:  Soft, nontender, nondistended with active bowel sounds.  He  was obese.  MUSCULOSKELETAL:  All 4 extremities are symmetrical with minimal flowing  noted in his feet.  Otherwise, no cyanosis or clubbing.  NEUROLOGIC:  Cranial nerves II through XII appeared to be grossly  intact.  Deep tendon reflex exam is deferred at this time.  PSYCH:  The patient seems to be alert and oriented x3 with an  appropriate affect.   LABORATORY DATA:  White blood cell count is 7400, hemoglobin 10.0,  hematocrit 30.0, and platelet count is 266,000.  INR is 1.5 and PT is  18.5.  Sodium 131, potassium 3.9, glucose 331, BUN 23, creatinine 1.50.   DIAGNOSTIC DATA:  Chest x-ray from 2 days ago on January 09, 2009, shows   that a ventricular lead appears to be partially severed near the  patient's pacemaker generator.  Otherwise, no acute abnormalities were  seen.   IMPRESSION:  1. Chest wall abscess (probable sebaceous cyst).  2. Pacemaker for paroxysmal atrial fibrillation and bradycardia, which      was placed in 2006.  3. Coronary artery disease.  4. Peripheral vascular disease.  5. Diabetes mellitus.  6. Hypertension.  7. On chronic Coumadin therapy.   PLAN:  At this time, it appears the patient has a chest wall abscess  likely secondary to an infected sebaceous cyst. This wound needs to be  further incised and debrided.  Once this is done, we will better be able  to determine if this infection is affecting the patient's pacemaker.  This has been discussed with the patient as well as his wife, who was in  the room as well, and at this time, they wished to proceed with the  bedside I&D.  Currently, the patient's INR is 1.5, which is suitable for  bedside I&D as well.  Once this wound is opened more, we will also  probably initially need to begin packing of wound 2 times a day with wet-  to-dry dressing changes.  However, we will know more exactly after this  procedure is complete.      Letha Cape, PA      Adolph Pollack, M.D.  Electronically Signed    KEO/MEDQ  D:  01/11/2009  T:  01/12/2009  Job:  [id number]   cc:   Lucila Maine, MD  Nanetta Batty, M.D.  Thereasa Solo. Little, M.D.

## 2011-05-12 NOTE — Discharge Summary (Signed)
NAME:  Matthew Lloyd, DOLECKI NO.:  [id number]   MEDICAL RECORD NO.:  [id number]          PATIENT TYPE:  INP   LOCATION:  2027                         FACILITY:  [facility]   PHYSICIAN:  Nanetta Batty, M.D.   DATE OF BIRTH:  10/26/1926   DATE OF ADMISSION:  11/03/2004  DATE OF DISCHARGE:  11/12/2004                                 DISCHARGE SUMMARY   DISCHARGE DIAGNOSES:  1.  Coronary disease with abnormal Cardiolite study as an outpatient.      Transferred from [facility] for further evaluation.  2.  Coronary disease with elective right coronary artery Cypher stenting      this admission with residual 70% circumflex lesion and an EF of 45-50%      on November 04, 2004.  3.  Previous history of peripheral vascular disease with remote right iliac      stenting by Dr. Nanetta Batty.  4.  Atrial fibrillation, new this admission.  5.  Obesity.  6.  Sleep apnea.  7.  Noninsulin-dependent diabetes mellitus.  8.  Treated hypertension.  9.  Dyslipidemia.  10. Coumadin therapy, new this admission.   HOSPITAL COURSE:  The patient is a 77 year old male with a history of  peripheral vascular disease.  He is followed by Dr. Lorin Picket in [place], [place].  He was referred to [facility] by his primary care  physician, Dr. Lucila Maine, with atrial fibrillation and fatigue.  He was  admitted to [facility] and underwent a Cardiolite on November 03, 2004 which showed an EF of 52% with some inferior ischemia.  The patient was  transferred to [facility] November 03, 2004 and admitted  by Dr. Ulyses Amor.  His enzymes were negative here.   He was admitted to the CCU and started on heparin and nitroglycerin.  His  atrial fibrillation was new and controlled with medications.  He underwent  diagnostic catheterization November 04, 2004 by Dr. Nanetta Batty which  reveals an 80% RCA. He underwent Cypher stenting.  There was a residual 70%  circumflex that Dr. Nanetta Batty would like to treat medically at this  time.  His EF was 45-50%.  He does have an abdominal aortic aneurysm, normal  renal arteries, and a patent right iliac stenting.  The patient was put on  Plavix, low dose aspirin, and started on Coumadin.  His right groin was  stable postoperatively.  We did have some problems with rate control.  At  times, his rate was 131 up and at times it was in the 70s and he did have a  2.5 second pause.  He did require Lanoxin, Metoprolol, and Cardizem for rate  control.   It took a long time to get him coumadinized and he remained in the hospital  on Lovenox, apparently he did not qualify for assistance with Lovenox and  could not afford it at home.  His wife was unable to administer this at  home.  Ultimately, he was discharged on November 12, 2004.  His INR is 2.0  at discharge.  He  will be discharged on Coumadin 10 mg a day and have a pro  time checked Monday.  He does need an abdominal ultrasound as an outpatient  to follow up his aneurysm.  This can be arranged with Dr. Nanetta Batty.   DISCHARGE MEDICATIONS:  1.  Altace 5 mg q.d.  2.  Zocor 20 mg q.d.  3.  Metoprolol 100 mg b.i.d.  4.  Isosorbide 30 mg q.d.  5.  Glucovance as taken at home.  6.  Proscar 5 mg q.d.  7.  Plavix 75 mg q.d.  8.  Cardura 1 mg h.s.  9.  Lanoxin 0.25 mg q.d.  10. Protonix 40 mg a day for a month and then he can use Prilosec OTC.  11. Cardizem CD 240 mg q.d.  12. Coumadin 10 mg q.d. or as directed.  13. Nitroglycerin sublingual p.r.n.  14. Aspirin 81 mg q.d. for one week and then stop.   LABORATORY DATA:  INR at discharge is 2.0.  Sodium 129, potassium 4.0, BUN  19, creatinine 0.9.  White count 6.5, hemoglobin 10.8, hematocrit 31.1,  platelets 298,000.  INR on admission was 1.2.  CK-MB and troponins are  negative.  Liver functions are normal.  His BNP on admission was 379.  Lipid  profile shows a cholesterol of 110, triglycerides 92,  LDL 47, HDL 45, TSH  1.08.   Chest x-ray shows cardiomegaly with question of a mild left basilar  atelectasis.  EKG shows atrial fibrillation with controlled ventricular  response at discharge.   DISPOSITION:  The patient is discharged in stable condition.  He will have a  pro time checked Monday.  Once he is on a stable Coumadin dose, his Coumadin  dosing could be turned over to his primary care physician, Dr. Lucila Maine.  He will see Dr. Nanetta Batty as an outpatient.  He will need an abdominal  ultrasound at some point to follow up his aneurysm.  His medications were  called in to the pharmacy in [place], [place] on [address].       LKK/MEDQ  D:  11/12/2004  T:  11/12/2004  Job:  [id number]   cc:   Lucila Maine, M.D.  [place], [facility]

## 2011-05-12 NOTE — Op Note (Signed)
NAME:  Matthew Lloyd, Matthew Lloyd NO.:  [id number]   MEDICAL RECORD NO.:  [id number]          PATIENT TYPE:  INP   LOCATION:  2012                         FACILITY:  [facility]   PHYSICIAN:  Bernette Redbird, M.D.   DATE OF BIRTH:  1926/02/02   DATE OF PROCEDURE:  11/30/2004  DATE OF DISCHARGE:                                 OPERATIVE REPORT   PROCEDURE PERFORMED:  Upper endoscopy.   ENDOSCOPIST:  Florencia Reasons, M.D.   INDICATIONS FOR PROCEDURE:  The patient is a 77 year old gentleman with heme  positive stool.  He was admitted to the cardiology service because of  bradycardia several days ago, probably related to medications.   FINDINGS:  No source of heme positivity.  Small hiatal hernia.   DESCRIPTION OF PROCEDURE:  The nature, purpose and risks of the procedure  had been discussed with the patient, who provided written consent.  He was  brought from his  hospital room to the endoscopy unit.  Sedation was  fentanyl 25 mcg and Versed 3 mg IV without any clinical instability.  He  maintained his atrial fibrillation rhythm with moderately rapid ventricular  response during the course of the procedure.   The Olympus small caliber adult video endoscope was passed under direct  vision.  The vocal cords were not well seen.  The esophagus was readily  entered and was normal, without evidence of reflux esophagitis, Barrett's  esophagus, varices, infection, neoplasia or any evident stricture.  There  was an intermittent, spontaneously reducing small to moderate-sized hiatal  hernia, most notable during insertion of the scope and less prominent during  withdrawal of the scope.  There appeared to be a widely patient esophageal  mucosal ring (Schatzki's ring) at the superior margin of the hiatal hernia.   The stomach contained no significant residual and had normal mucosa,  specifically without evidence of any erosions, ulcers, polyps or masses and  a retroflex view of the cardia  was unremarkable.  No gastritis or vascular  ectasia were noted.  The pylorus was slightly asymmetric and the duodenal  bulb seemed to be a little bit foreshortened but no frank peptic scarring  was observed and there was certainly no evidence of any active peptic  disease.  The second duodenum looked normal.   The scope was then removed from the patient.  No biopsies were obtained.  The patient tolerated the procedure well and there were no apparent  complications.   IMPRESSION:  Small hiatal hernia but otherwise essentially normal endoscopy,  without source of heme positive stool endoscopically evident (6644).   PLAN:  Proceed to colonoscopic evaluation for further evaluation of the heme  positive stool.       RB/MEDQ  D:  11/30/2004  T:  11/30/2004  Job:  [id number]   cc:   Nanetta Batty, M.D.  Fax: [phone]   Dr. Lucila Maine, [place]

## 2011-05-12 NOTE — Discharge Summary (Signed)
NAME:  Matthew Lloyd, Matthew Lloyd NO.:  [id number]   MEDICAL RECORD NO.:  [id number]          PATIENT TYPE:  INP   LOCATION:  2038                         FACILITY:  [facility]   PHYSICIAN:  Nanetta Batty, M.D.   DATE OF BIRTH:  August 03, 1926   DATE OF ADMISSION:  01/17/2005  DATE OF DISCHARGE:                                 DISCHARGE SUMMARY   ADMISSION DIAGNOSES:  1.  Atrial fibrillation with symptomatic tachybrady syndrome at a heart rate      in the 20s on admission.  2.  Coumadin therapy.  3.  Coronary artery disease status post Cypher stent to the right coronary      artery.  4.  Adult-onset diabetes mellitus, insulin dependent.  5.  Peripheral vascular occlusive disease with a history of hiatal hernia,      right iliac artery stent, and a right renal artery stent.  6.  Hypertension.  7.  Chronic obstructive pulmonary disease.  8.  Benign prostatic hypertrophy.  9.  Anemia with prior gastrointestinal workup.  10. Hyperlipidemia.   DISCHARGE DIAGNOSES:  1.  Atrial fibrillation with symptomatic tachybrady syndrome at a heart rate      in the 20s on admission.  2.  Coumadin therapy.  3.  Coronary artery disease status post Cypher stent to the right coronary      artery.  4.  Adult-onset diabetes mellitus, insulin dependent.  5.  Peripheral vascular occlusive disease with a history of hiatal hernia,      right iliac artery stent, and a right renal artery stent.  6.  Hypertension.  7.  Chronic obstructive pulmonary disease.  8.  Benign prostatic hypertrophy.  9.  Anemia with prior gastrointestinal workup.  10. Hyperlipidemia.   PROCEDURES:  Implantation of a EnRhythm P1501DR permanent transvenous  pacemaker serial number [id number].  Leads are [employer] 5594 53 cm and  5092 58 cm.   BRIEF HISTORY:  The patient is a 77 year old white male, a medical patient  of Dr. Lucila Maine of [facility] with a known history of  atrial fibrillation on chronic  Coumadin.  He presented to the office on the  day of admission complaining of just feeling bad, no energy.  He had  difficulty walking from the exam room from the front desk.  He was placed in  the room and hooked up for an EKG which showed a bradycardia with a heart  rate in the upper 20s.  He was subsequently transported to [facility] emergently for further treatment as indicated.  Past medical  history is listed above.   MEDICATIONS ON ADMISSION:  1.  Fortamet 1 g daily.  2.  Cartia XT 240 mg daily.  3.  Protonix 40 mg daily.  4.  Plavix 75 mg daily.  5.  Metoprolol 50 mg b.i.d.  6.  He was on Coumadin 7.5 mg daily.  7.  Doxazosin 1 mg daily.  8.  Digoxin 0.25 mg daily.  9.  Avandia 4 mg daily.  10. Proscar 5 mg.  11. Furosemide 30 mg.  12. Lipitor  10 mg.   ALLERGIES:  None.   For further history and physical please see the dictated note.   HOSPITAL COURSE:  The patient was admitted, transferred to a telemetry unit  by ICU EMTs from the office.  The patient was found to have an elevated  protime which was expected since he is on chronic Coumadin.  He was given 1  mg of IV potassium.  He remained stable overnight.  The next day, INR was  still up and he was given an additional 1 mg of oral vitamin K.  His protime  came down to a reasonable level.  He was maintained on heparin and then  scheduled for surgery on January 19, 2005 at which time he underwent the  above-noted procedure.  It was tolerated without difficulty.  He was  transferred to the floor and the following a.m. he was seen and it was Dr.  Jodi Marble opinion that he could go home.  It was his impression that the  patient had a complete heart block with junctional escape rhythms.  2.  Sick  sinus syndrome.  3.  Coronary artery disease with history of Cypher stent.  4.  Anemia.  Chest x-ray shows a pneumothorax and a chest tube was  subsequently placed.  We held his Coumadin, restarted most of his   nonchronotropic medications, and he was scheduled for surgery when his INR  was down.  This took 48 hours.  The patient's INR finally came down and he  was taken to the catheterization laboratory on January 19, 2005 at which  time he underwent permanent transvenous pacemaker with DDD dual leads.  The  patient tolerated the procedure well.  He has done well with his pacemaker,  had no significant arrhythmias, and we anticipate discharge home in the a.m.   DISCHARGE ACTIVITY:  Light to moderate.   INSTRUCTIONS:  No lifting over 10 pounds, no driving, no strenuous activity.  He will return to see Dr. Jenne Campus in about 1 week.  We will have him  scheduled for routine follow-up to monitor his cardiac status as before.   DISCHARGE MEDICATIONS:  1.  Cardizem 240 mg daily.  2.  The patient may continue the Cartia XT 240 daily.  3.  Cardura 1 mg h.s.  4.  Tricor 145 mg h.s. which is new.  5.  Lasix 20 mg daily.  6.  Lantus 28 units daily.  7.  Lopressor 100 mg one-half tablet b.i.d.  8.  Protonix 40 mg daily.  9.  Lipitor 10 mg h.s.  10. Glucophage 1 g daily.  11. Coumadin 7.5 mg q.a.m.  12. Avandia 45 mg daily.  13. Lanoxin 0.25 mg daily.  14. Aspirin 325 mg daily.  15. Plavix 75 mg daily.   The patient will return next week to have a protime checked and be seen in  the office by Dr. Jenne Campus or extender.  He will then come back for his  routine 6-week follow-up.   DISCHARGE ACTIVITY:  Light to moderate.  He can take Tylenol 325 mg two or  three q.4h. p.r.n. for pain.  Discharge activity is light for 1 week.   DISCHARGE DIET:  Diabetic low fat.   He is to clean his site with plain soap and water.  Call if he has any  problems.  We will see him back in 1 week  Six-week follow-up for  interrogation.  The patient is to restart his Coumadin on his home dose  tomorrow  and have his protime checked next Tuesday or Wednesday.  He is to clean his incision with plain soap and water and change  dressing as needed.   CONDITION ON DISCHARGE:  Improving.      WDJ/MEDQ  D:  01/20/2005  T:  01/20/2005  Job:  [id number]   cc:   Dr. Vena Rua, M.D.

## 2011-05-12 NOTE — Op Note (Signed)
NAME:  Matthew Lloyd, Matthew Lloyd NO.:  [id number]   MEDICAL RECORD NO.:  [id number]          PATIENT TYPE:  INP   LOCATION:  2012                         FACILITY:  [facility]   PHYSICIAN:  Bernette Redbird, M.D.   DATE OF BIRTH:  1926-06-22   DATE OF PROCEDURE:  11/30/2004  DATE OF DISCHARGE:                                 OPERATIVE REPORT   PROCEDURE PERFORMED:  Colonoscopy (partial).   ENDOSCOPIST:  Florencia Reasons, M.D.   INDICATIONS FOR PROCEDURE:  The patient is a 77year-old gentleman admitted  to the hospital several days ago with bradycardia related to cardiac  medications.  During this hospitalization, he was noted to be hemoccult  positive and because of this, and the anticipated need for long term  anticoagulation for his atrial fibrillation, GI tract evaluation was  requested.  The patient has no worrisome risk factors or symptoms.   FINDINGS:  1.  Partial exam, unable to reach cecum.  No source of heme positivity noted      to the limit of the exam.  Wide complex tachycardia occurred transiently      several times during the procedure.   DESCRIPTION OF PROCEDURE:  The nature, purpose and risks of the procedure  had been discussed with the patient, who provided written consent and was  brought from his hospital room to the endoscopy unit following a mag citrate  and NuLytely prep.  Sedation was intentionally kept on the light side  because of the patient's medical problems and body habitus which appeared to  put him at some risk for sleep apnea.  Tiotal sedation for this procedure  and the upper endoscopy which preceded it was fentanyl 50 mcg and Versed 3  mg IV.   During the procedure, there were several brief episodes of wide complex,  nonsustained tachycardia.  These seemed to occur during times when the  patient was having greater discomfort with advancement of the scope.  The  longest of these lasted several seconds but during this time the patient was  awake and alert and not in any evident distress nor was he overtly  clinically unstable.   We used the Olympus adjustable tension pediatric video colonoscope for this  exam and it was advanced gradually around the colon to what I believe was  the region of the hepatic flexure.  We may actually have gotten proximal to  that location but it is hard to tell.  I was never able to reach the cecum  due to excessive looping.  The patient is quite obese and has very firm  belly to begin with and it was thus hard to compress and control loops.  Given the presence of the wide complex tachycardia noted above, it was felt  that strenuous efforts to reach the cecum or even switching to the adult  scope and giving it another try was probably not prudent, so pullback was  initiated.  The quality of the prep was excellent and it is felt that all  areas up to the limit of the exam were well seen.   In  the rectum were a couple of tiny polyps which I did not feel were  clinically significant and which I elected not to biopsy.  Retroflexion in  the rectum showed one of these but was otherwise unremarkable.  No large  polyps, significant polyps, cancer, colitis, vascular malformations or  diverticulosis were noted up to the limit of the exam.   We had the patient in the supine position during part of the procedure to  facilitate advancement.  Following the procedure, Dr. Nicki Guadalajara of  cardiology was able to stop by and see the patient.  At the moment, it is  unclear whether the wide complex tachycardia represented ventricular  tachycardia or more likely, an acceleration of his atrial fibrillation  (almost 300 beats per minute) with aberrant conduction.  The fact that the  arrhythmia occurred primarily during times of increased pain during the  procedure would perhaps favor the latter possibility.   IMPRESSION:  1.  Incomplete exam, although the vast majority of this colon was able to be      examined  during this procedure.  See above for details.  2.  Up to the limit of the exam, the only abnormality was a tiny rectal      polyp, which was not biopsied.  No source of heme positivity was      endoscopically evident (4166).  3.  Wide complex tachycardia during the procedure as noted above.   PLAN:  I have discussed the case with Dr. Tresa Endo.  We feel that it probably  is not necessary to put the patient through a barium enema to examine the  small remaining part of the colon.  Instead, it would probably be more  prudent to monitor Hemoccults as an outpatient and/or look for evidence of  anemia over time.  If this becomes an issue for the patient as an  outpatient, a barium enema could be done electively as an outpatient through  the patient's local medical facilities or here in [place].  However, we  will not pursue it at the present time.       RB/MEDQ  D:  11/30/2004  T:  11/30/2004  Job:  [id number]   cc:   Dr Lucila Maine, [place]   Nanetta Batty, M.D.  Fax: [phone]

## 2011-05-12 NOTE — Consult Note (Signed)
NAME:  Matthew Lloyd, Matthew Lloyd NO.:  [id number]   MEDICAL RECORD NO.:  [id number]          PATIENT TYPE:  INP   LOCATION:  2012                         FACILITY:  [facility]   PHYSICIAN:  Matthew Lloyd, M.D.   DATE OF BIRTH:  07/03/1926   DATE OF CONSULTATION:  11/29/2004  DATE OF DISCHARGE:                                   CONSULTATION   Dr. Domingo Lloyd, covering for Dr. Allyson Lloyd, asked me to see this 77 year old  gentleman because of heme-positive stool.   Matthew Lloyd was admitted to the hospital several days ago with severe  bradycardia probably related to over-medication and then coughed up or  retched up a small amount of blood, by report (he is really not aware of  that) and was subsequently found to be Hemoccult-positive.  He has not had  any clinically overt GI bleeding.  They have had problems with over-  anticoagulation since he came in with an INR of 4.3, but his Coumadin has  been held and his INR as of this morning is 1.6.  His hemoglobin has been  holding steady while in the hospital and is currently 11.2.  Nonetheless, he  was found to be Hemoccult-positive and therefore GI tract evaluation was  felt to be warranted, especially in view of the anticipated long-term need  for anticoagulation.  The patient has atrial fibrillation.   PAST MEDICAL HISTORY:  No known allergies.   Outpatient medications are multiple and include Zocor, Cardura, Vasotec,  Metformin, Imdur, Proscar, Avandia, Altace, metoprolol, digoxin, potassium,  furosemide, __________ I'm sorry, I am not able to read that one, Plavix,  and Coumadin.   Operations include a carotid endarterectomy and peripheral vascular  stenting.  He is also remotely status post a wide excision for melanoma.   Medical illnesses include coronary disease, peripheral vascular disease,  hypertension, type 2 diabetes, hyperlipidemia, BPH, COPD, atrial  fibrillation.   HABITS:  Occasional ethanol.  Former smoker who quit 20  years ago.   FAMILY HISTORY:  Negative for GI tract illnesses.   SOCIAL HISTORY:  Retired Scientist, product/process development.  Married, wife at bedside.   REVIEW OF SYSTEMS:  Negative for upper tract or lower tract GI symptoms such  as anorexia, dysphagia, reflux symptoms, abdominal pain, constipation,  diarrhea, or rectal bleeding.   PHYSICAL EXAMINATION:  GENERAL:  This is a rather stout, hard-of-hearing  gentleman, pleasant, cooperative, in no evident distress, anicteric, no  frank pallor.  CHEST:  Clear.  CARDIAC:  Heart has an irregularly irregular rhythm.  ABDOMEN:  ___________ and is quite adipose and firm but without obvious mass  or tenderness.   LABORATORY DATA:  See HPI.  Liver chemistries were minimally elevated on  admission with an AST of 54 and an ALT of 44.  Albumin 3.4.  Platelet count  normal.  White count normal when checked yesterday.   IMPRESSION:  Hemoccult-positive stool in a patient without localizing  gastrointestinal tract symptoms, requiring chronic anticoagulation for  atrial fibrillation.  Also a history of multiple medical problems as noted  above.   Dr. Domingo Lloyd does feel the  patient is clinically stable to undergo endoscopy  and colonoscopy, status post stenting for coronary disease.  At this time  his rhythm is a little rapid but is being corrected medically.  I have  discussed the nature, purpose, and risks of endoscopy and colonoscopy with  the patient and his wife, and they are agreeable to proceed.  I do feel the  procedures are appropriate given the need for chronic anticoagulation in the  presence of heme-positive stool.  We will stop the patient's heparin about  six hours prior to the procedure.  Further management to depend upon the  endoscopic findings and colonoscopic findings.       RB/MEDQ  D:  11/29/2004  T:  11/30/2004  Job:  [id number]   cc:   Lucila Maine, M.D.  [place], [place]   Nanetta Batty, M.D.  Fax: [phone]

## 2011-05-12 NOTE — H&P (Signed)
NAME:  MANINDER, DEBOER NO.:  [id number]   MEDICAL RECORD NO.:  [id number]          PATIENT TYPE:  INP   LOCATION:  2922                         FACILITY:  [facility]   PHYSICIAN:  Ulyses Amor, MD DATE OF BIRTH:  05-Feb-1926   DATE OF ADMISSION:  11/03/2004  DATE OF DISCHARGE:                                HISTORY & PHYSICAL   HISTORY:  Matthew Lloyd is a 77 year old white man who was transferred from  [facility] to [facility] to further investigate the  possibility of coronary artery disease.  In addition, he has been found to  have atrial fibrillation with a rapid ventricular rate of undetermined  duration.   The patient has no past history of cardiac disease.  Specifically, there is  no history of chest pain, myocardial infarction, coronary artery disease,  congestive heart failure or arrhythmia. However, he has an extensive history  of peripheral vascular disease.  He has undergone a right carotid  endarterectomy and a right external iliac stent.  In addition, he is known  to have an abdominal aortic aneurysm and a right renal artery stenosis.   The patient presented to his personal physician with a several-week history  of generalized fatigue.  He is ordinarily dyspneic, but noted that during  this period of time, his dyspnea has worsened.  He reports no chest pain,  heaviness, pressure or squeezing.  Nor has he noted palpitations, irregular  heartbeat.  There is no syncope or near syncope.  He was found to be in  atrial fibrillation with a rapid ventricular rate, and was admitted to  [facility].  There cardiac enzymes excluded the presence of acute  myocardial infarction.  Various measures were implemented to control his  ventricular rate including metoprolol, digoxin and diltiazem.  An adenosine  Cardiolite was performed in order to evaluate for the possibility of  coronary artery disease.  This demonstrated an ejection fraction of  52% with  evidence of inferior and lateral ischemia.  Hence, he was transferred to  [facility] for further evaluation.   In addition to the aforementioned problems, the patient has a history of  hypertension, diabetes mellitus, and dyslipidemia.  There is a family  history of coronary artery disease.  In addition, the patient smoked until  approximately 22.   Furthermore, the patient has a history of benign, prostatic hypertrophy,  chronic obstructive pulmonary disease, and melanoma which was removed 23  years ago.   MEDICATIONS:  The patient was transferred on a number of medications, and  these include:  1.  Aspirin.  2.  Metoprolol.  3.  Lovenox.  4.  Simvastatin.  5.  Glyburide.  6.  Metformin.  7.  Finasteride.  8.  Avandia.  9.  Cardura.  10. Digoxin.  11. Diltiazem.   ALLERGIES:  The patient is not allergic to any medications.   PREVIOUS OPERATIONS:  Right carotid endarterectomy, left leg melanoma.   SOCIAL HISTORY:  The patient lives with his wife.  He retired.  He spends  most of his day watching television.  At  home, he gets no __________ .  He  no longer smokes.  He does not drink.   FAMILY HISTORY:  Father died of congestive heart failure at age 28.  Mother  died of congestive heart failure at age 83.   REVIEW OF SYSTEMS:  Revealed no new problems related to his head, eyes,  ears, nose, mouth, throat, gastrointestinal, genitourinary system, or  extremities.  There is no history of neurological or psychiatric disorder.  There is no history of fever, chills, or weight loss.   PHYSICAL EXAMINATION:  VITAL SIGNS:  Blood pressure 114/68, pulse 88 and  irregularly regular.  Respirations 22.  Temperature 98.6.  GENERAL:  The patient was an obese, elderly white man, in no discomfort.  He  was alert, oriented and appropriate, responsive.  HEENT:  __________  significant hard of hearing though moderately hard of  hearing.  Head, eyes, nose, and mouth are  unremarkable.  NECK:  Without thyromegaly or adenopathy.  Carotid pulses palpable  bilaterally without bruits.  CARDIAC:  Examination revealed an irregularly regular rhythm.  There was no  murmur, gallop, rub or click.  No chest wall tenderness was noted.  LUNGS:  Examination of the lungs revealed diffusely diminished breath  sounds; there were no rales or rhonchi.  ABDOMEN:  The abdomen was obese or protuberant.  There was no mass,  hepatosplenomegaly, bruits, distension, rebound, guarding, or rigidity.  Bowel sounds were normal.  RECTAL/GENITAL:  Examinations were not performed and really not pertinent to  the reason for acute care hospitalization.  EXTREMITIES:  Examination of the extremities revealed moderate edema, equal  bilaterally.  There was no deviation or deformity.  Radial and dorsalis  pedal pulses were palpable bilaterally.  NEUROLOGIC:  Brief screening neurologic survey was unremarkable.   STUDIES:  Electrocardiogram from [facility] demonstrated atrial fibrillation  at a rapid ventricular rate.  They were otherwise unremarkable.   Chest radiograph at [facility] demonstrated no significant infiltrate or  edema.  Heart size was at the upper limits of normal.   Cardiac enzymes were negative.  BNP was 321.  Potassium was 4.2, BUN 12,  creatinine 0.9.  White count was 6.7 with a hemoglobin of 11.4 and  hematocrit of 32.  All of his laboratory studies from [facility] as well as  his electrocardiogram  and chest radiograph are pending at the time of this  dictation.   IMPRESSION:  1.  Abnormal adenosine Cardiolite with inferolateral ischemia, dyspnea; rule      out coronary artery disease.  2.  Atrial fibrillation with rapid ventricular rate of undetermined      duration.  3.  Peripheral vascular disease, status post right carotid endarterectomy,      status post right external iliac shunt and stent.  Right renal artery     stenosis.  4.  Abdominal aortic aneurysm.  5.   Hypertension.  6.  Diabetes mellitus.  7.  Dyslipidemia.  8.  Benign prostatic hypertrophy.  9.  Anemia.  10. Status post melanoma.  11. Chronic obstructive pulmonary disease.   PLAN:  1.  Coronary care unit.  2.  Intravenous heparin.  3.  Aspirin.  4.  Nitrites.  5.  Continue metoprolol, digoxin and diltiazem.  6.  Evaluate anemia.  7.  Further measures per Dr. Allyson Sabal.      [place]   MSC/MEDQ  D:  11/03/2004  T:  11/04/2004  Job:  [id number]   cc:   Nanetta Batty, M.D.  Fax: [phone]

## 2011-05-12 NOTE — Discharge Summary (Signed)
NAME:  Matthew Lloyd, Matthew Lloyd NO.:  [id number]   MEDICAL RECORD NO.:  [id number]          PATIENT TYPE:  INP   LOCATION:  2012                         FACILITY:  [facility]   PHYSICIAN:  Madaline Savage, M.D.DATE OF BIRTH:  [birth year]   DATE OF ADMISSION:  11/26/2004  DATE OF DISCHARGE:  12/09/2004                                 DISCHARGE SUMMARY   HISTORY OF PRESENT ILLNESS:  Mr. Matthew Lloyd is a 77 year old male patient with a  history of coronary artery disease and peripheral vascular disease. He  recently had been admitted to the hospital from November 03, 2004 to  November 12, 2004. At that time, he was found to have atrial fibrillation.  He had a Cardiolite scan that was positive in the inferior area. Thus, he  came and underwent cardiac catheterization. He was found to have coronary  disease with a CYPHER stent 3.5 x 33 placed in his right coronary artery. He  has a 70% circumflex lesion. His ejection fraction was 45% to 50%. He was  discharged home on Plavix and Coumadin. Apparently the day of admission, he  started feeling bad after taking his morning pills. He had dry heaves. He  went to [facility]. He was found to have a heart rate of 20 to 30. He  was put on an Isuprel drip. Abdominal CT scan was done and a KUB. It was  thought that he might have a mild ileus. Also his creatinine was slightly  elevated at 1.6. His BUN was 30. His potassium was 6.1 and his sodium was  133. He was sent to [facility] for further evaluation. He was seen  by Dr. Kem Lloyd on admission. It was decided to hold his negative  chronotropic medications and to follow along. He continued to be in atrial  fibrillation. The rate was not slow. His Isuprel was discontinued. His  medications were added back slowly. Beta blocker was resumed on November 27, 2004, at a decreased dose. On November 29, 2004, he was started on his  Cardizem. His Coumadin was resumed. His INR had been in the  4 range, when he  was first admitted. On November 29, 2004, it was 1.6. It was then decided  that maybe he needed gastrointestinal consultation. It was decided that he  needed to undergo esophagogastroduodenoscopy and colonoscopy, which he did.  He had a small hiatal hernia with a Schatski's ring. No source of heme-  positive stool. He had a colonoscopy that was normal to the proximal colon.  He had tiny rectal polyps, which were not biopsied. He was thought by the  gastroenterologist to have wide complex tachycardia. This was thought to be  artifact. It was decided that he should have a barium enema secondary to the  fact that he did not have a full colonoscopy. The barium enema was negative.  He only had a solitary diverticulum in the sigmoid. Dr. Matthias Lloyd thought he  was okay for chronic anticoagulation, although he did say there is a slight  possibility that he had vascular ectasia and his cecum was full  of bile,  that would not have been excluded on the test that had been done. He was  then restarted back on his Coumadin. He was seen by diabetes management  team. We did draw iron studies. They were all negative. He continued to be  anemic. His heart rate continued to be difficult to control. It was 90 to  120. During the day, starting around 2:00 for several hours, it was drop  down into the 80's. His INR slowly rose. We had to put him on high dose  Coumadin 10/12.5 every other day. On December 09, 2004, he was considered  therapeutic at 1.2. His intravenous heparin was discontinued and he was  considered stable to be discharged home. He had been walking in the halls  even though his heart rate spikes up to 150 at times. He does not feel this.  He had no signs of congestive heart failure. His heart rate mostly was  between 100 and 110 to sometimes 120 and as stated before, it drops down  into the 80's around 2:00. When it does drop down into the 80's, he starting  having pauses. Thus, it  was decided not to increase any of his chronotropic  medications any further.   LABORATORY DATA:  INR on the day of discharge was 2.1. On December 08, 2004,  it was 1.7. Hemoglobin 10.0, hematocrit 30.7. White blood cell count 6.3 and  platelets 217,000. INR on admission was 4.3. Occult blood was positive x2 in  his stool. Sodium 135, potassium 4.1, BUN 16, creatinine 1.1, glucose 166,  AST 20, ALT 21. Anemia studies were all normal except his red blood cells  folate was 1032, which is high. His digoxin level was 1.2. BNP on November 26, 2004 was 422. CK-MBs and troponins were negative x2. Urine did not show  any significant pathology. He had a C. diff toxin that was negative.   X-rays on November 30, 2004, returned __________ negative. Abdominal  ultrasound on November 28, 2004, showed thickening of the gallbladder wall  without signs to suggest acute cholecystitis. No stones. He had a left renal  stone without evidence of hydronephrosis. Chest x-ray on November 28, 2004,  showed no edema. Stable heart size. On November 27, 2004, abdominal x-ray  showed a mild ileus.   DISCHARGE MEDICATIONS:  1.  Plavix 75 mg 1 time per day.  2.  Avandia 4 mg 1 time per day.  3.  Proscar 5 mg 1 time per day.  4.  Protonix 40 mg 1 time per day.  5.  Cardura 1 mg at bedtime.  6.  Metoprolol 100 mg 2 tablets per day.  7.  Lipitor 10 mg 1 time per day.  8.  Lasix 20 mg 1 time per day.  9.  Digoxin 0.25 mg 1 time per day.  10. Fortamet 1,000 mg every morning.  11. Coumadin 10 mg every day.  He should take it at 5 or 6 p.m. Prior to his      discharge today, he was given 12.5 mg. He will not take anymore further      Coumadin today.  12. Lantus insulin 10 units at bedtime.  13. Cardizem CD 240 mg 1 time per day.  14. He is to stop his Zocor, potassium supplement, Altace, Imdur and      Glucovance.  To note, we did stop his Zocor secondary to fear of the      interaction between the Zocor and the  Cardizem.  ACTIVITY:  As tolerated.   DIET:  He should be on a diabetic diet. He will have his pro-time checked on  Monday. After that, he will have to have it done every week for the next 5  to 6 weeks for possible [facility] cardioversion in the future.   FOLLOW UP:  With Dr. Lorin Picket about his diabetes. He will followup with Dr.  Elwyn Lade on December 29, 2003, at 9:30 a.m.   SPECIAL INSTRUCTIONS:  These instructions were verbally explained by myself  to the patient's wife.   DISCHARGE DIAGNOSES:  1.  Paroxysmal atrial fibrillation with sick sinus syndrome of unknown      duration, started in November. When he was admitted, his heart rate was      20 to 30. He was nauseated and his potassium was elevated and he had a      mild ileus at the time. He is now back on the same chronotropic      medications that he was on prior to his admission. His heart rate has      been difficult to control. When his heart rate does drop down      occasionally into the 80's, he starts having pauses, mostly 2 to 2.5 or      less. We did not want to increase his medications any further for fear      that his heart rate would decrease again. We will plan on a [facility]      cardioversion in the near future, after he has been on his      anticoagulation about 5 weeks.  2.  Coronary artery disease status post stenting with CYPHER stent to his      right coronary artery. He is on Plavix along with his Coumadin.  3.  Anticoagulation. While here in the hospital, it was difficult to get his      INR to decline. He needed to receive high dose 10 mg or 12.5 mg. At the      time of discharge, his INR is 2.1. This will be followed closely on a      weekly basis, prior to his [facility] cardioversion.  4.  Insulin-dependent diabetes mellitus. His blood sugars have not been well      controlled in the hospital. He apparently recently has been started on      Lantus as an outpatient. This was found out a couple of days prior to      this  discharge. He is now back on his medications as changed by Dr.      Lorin Picket prior to his admission. He was told to followup with Dr. Lorin Picket.  5.  Ejection fraction of 45%.  6.  History of peripheral vascular disease with abdominal aneurysm. He has      had a right iliac and renal stenting.  7.  Anemia. He had a gastrointestinal workup that was negative and iron      tests were done, which were negative.  8.  Hypertension. His blood pressure now is controlled.  9.  Heme-positive stools.  10. Chronic obstructive pulmonary disease.  11. Benign prostatic hypertrophy.  12. Hyperlipidemia.       BB/MEDQ  D:  12/09/2004  T:  12/11/2004  Job:  [id number]   cc:   Lucila Maine, M.D.  Mady Haagensen, [place]

## 2011-05-12 NOTE — Cardiovascular Report (Signed)
NAME:  Matthew Lloyd, Matthew Lloyd NO.:  [id number]   MEDICAL RECORD NO.:  [id number]          PATIENT TYPE:  OIB   LOCATION:  2899                         FACILITY:  [facility]   PHYSICIAN:  Darlin Priestly, MD  DATE OF BIRTH:  06-19-1926   DATE OF PROCEDURE:  10/18/2005  DATE OF DISCHARGE:                              CARDIAC CATHETERIZATION   PROCEDURE:  DC cardioversion.   SURGEON:  Darlin Priestly, M.D.   COMPLICATIONS:  None.   INDICATIONS FOR PROCEDURE:  [name] is a 77 year old male patient of  Nanetta Batty, M.D., with a history of CAD, history of low normal EF,  history of coronary artery bypass surgery, history of permanent pacemaker  implant secondary to symptomatic bradycardia.  Since then found the patient  has been noted to be in persistent atrial fibrillation.  He has been on  Coumadin with therapeutic INR for one month.  He is now brought for elective  DC cardioversion.   DESCRIPTION OF PROCEDURE:  After obtaining informed written consent, the  patient was brought to the outpatient section C in a fasting state.  His  pacer interrogation confirmed persistent atrial fibrillation.  He then  received Pentothal by Dr. __________ and after adequate anesthesia, he  received one biphasic shock at 150 joules with conversion to normal sinus  rhythm, again confirmed with pacer interrogation.  He remained  hemodynamically stable.  His pacer was programmed to a DDDR mode at a lower  atrial rate of 70 to try to overdrive his atrial arrhythmias.  We did turn  his MVP mode on as well as antitachycardia algorithms.   CONCLUSION:  Successful direct current cardioversion to normal sinus rhythm  with pacer reprogramming.      Darlin Priestly, MD  Electronically Signed     RHM/MEDQ  D:  10/18/2005  T:  10/18/2005  Job:  [id number]

## 2011-05-12 NOTE — Cardiovascular Report (Signed)
NAME:  AMAHRI, DENGEL NO.:  [id number]   MEDICAL RECORD NO.:  [id number]          PATIENT TYPE:  INP   LOCATION:  2922                         FACILITY:  [facility]   PHYSICIAN:  Nanetta Batty, M.D.   DATE OF BIRTH:  Feb 10, 1926   DATE OF PROCEDURE:  DATE OF DISCHARGE:                              CARDIAC CATHETERIZATION   CARDIAC CATHETERIZATION/PCI AND STENT   Mr. Dayal is a 77 year old white male who has a history of:  1.  PVOD, status post right carotid endarterectomy, and right iliac stent.  2.  He also has known renal artery stenosis and abdominal aortic aneurysm.  3.  His other problems include, COPD with remote tobacco abuse.  4.  Hypertension.  5.  Dyslipidemia.  6.  Diabetes.   He was admitted to [facility] with AFib with rapid ventricular  response.  A Persantine Cardiolite revealed inferolateral ischemia.  He was  put on IV heparin and transferred for further evaluation including  diagnostic coronary arteriography.   PROCEDURE DESCRIPTION:  The patient was brought to the second floor [facility], Cardiac Catheterization Laboratory, in the post-absorptive  state.  He was premedicated with p.o. Valium and IV Nubain for back pain.  His right groin was prepped and draped in the usual sterile fashion.  Xylocaine 1% used for local anesthesia.  A 6 later upgraded to a 7-French  sheath was inserted into the right femoral artery using standard Seldinger  technique.  A 7-French sheath was then inserted into the right femoral vein.  A 7-French balloon tip Swan-Ganz catheter was used to obtain sequential  heart pressures.  A 6-French right and left Judkins diagnostic catheter as  well as a [place] pigtail catheter were used for selective coronary  angiography, left ventriculography, sub-selective left internal mammary  artery angiography, and distal abdominal aortography.  Visipaque dye was  used for the entirety of the case.  Retrograde aorta,  left ventricular  pullback pressures were recorded.   HEMODYNAMICS:  1.  Aortic systolic pressure 151; diastolic pressure 101.  2.  Left ventricular systolic pressure 141 and diastolic pressure 37.  3.  Right atrial pressure A wave 32, V wave 31, mean 28.  4.  Right ventricular pressure systolic 48, diastolic pressure 22.  5.  PA pressure systolic 50, diastolic pressure 34.  6.  PA pressure, A wave 42, V wave 35, mean 32.   SELECTIVE CORONARY ANGIOGRAPHY:  1.  Left main, normal.  2.  LAD, normal.  3.  Left circumflex. The left circumflex was non-dominant and it had a 70%      segmental stenosis in the distal third before the second marginal branch      takeoff.  4.  Right coronary artery.  This is a large dominant vessel with fairly      focal 80% mid with a proximal segmental 50% hypodense lesion.   LEFT VENTRICULOGRAPHY:  1.  RAO left ventriculogram was performed using 75 cc of Visipaque dye at 12      cc/second.  The overall LVF was estimated at approximately 50% with mild  to moderate inferior hypokinesia.  2.  Left internal mammary artery.  This vessel was sub-selectively described      as widely patent, appears suitable for use during coronary artery bypass      grafting.  3.  Distal abdominal aortography performed using 20 cc of Visipaque dye at      20 cc/second x 2.  The renal arteries appear widely patent.  The right      common iliac artery stent appeared widely patent.  There was a small      fusiform distal abdominal aortic aneurysm noted.   IMPRESSION:  1.  Mr. Sroka has two-vessel disease with a tight lesion in a dominant right      coronary.  2.  He is in atrial fibrillation with a rapid ventricular response.  3.  He has high right sided pressures.   PLAN:  Perform PCI and stenting of the mid right using a CYPHER drug-eluting  stent.  Consideration was given to the fact that he will need Coumadin  anticoagulation for his AFib.  However, this is a large  dominant vessel and  requires continued long term patency.   PROCEDURE DESCRIPTION:  The patient received 4,000 units of heparin  intravenously with a final ACT approximately 293.  The patient received  aspirin 325, Plavix 600 p.o., Integrilin double bolus infusion.  Using a 7-  [place] JR4 guide catheter with an LM4 190 Sport guidewire, and 2.5/15-mm  Maverick PTCA is performed to the mid right coronary artery with nominal  pressures.  Following this a 3.5/33-mm CYPHER drug-eluting stent was then  deployed under angiographic and fluoroscopic control covering the entire mid  portion of the right coronary.  It was then deployed at 12 atmospheres which  appeared to match vessel size 1:1.  There was some ST segment elevation with  balloon inflation which resolved with balloon deflation.  The patient  tolerated the procedure well.  The final angiographic result was reduction  of an 80% focal and 50-60% segmental mid dominant RCA stenosis to 0%  residual TIMI 3 flow.  The guidewire and catheter is removed.  The sheaths  were securely in place.  The patient  left the lab in stable condition.  He will be treated with aspirin and  Plavix.  Integrilin will be continued for 18 hours.  The sheaths will be  removed once __________ falls below 150.  His circumflex lesion will be  treated medically.  Plans will be pursue heparin and Coumadin  anticoagulation.  He left the lab in a stable condition.       JB/MEDQ  D:  11/04/2004  T:  11/04/2004  Job:  [id number]   cc:   patient's chart   [facility]   [facility]  [address]   Lucila Maine, Dr.  [facility]  [place], [place]

## 2011-05-12 NOTE — Cardiovascular Report (Signed)
NAME:  Matthew Lloyd, Matthew Lloyd NO.:  [id number]   MEDICAL RECORD NO.:  [id number]          PATIENT TYPE:  INP   LOCATION:  2920                         FACILITY:  [facility]   PHYSICIAN:  Darlin Priestly, MD  DATE OF BIRTH:  September 18, 1926   DATE OF PROCEDURE:  01/19/2005  DATE OF DISCHARGE:                              CARDIAC CATHETERIZATION   PROCEDURE:  Insertion of a Medtronic In-Rhythm P1501DR pacemaker, serial  number [id number] with passive atrial and ventricular leads.   ATTENDING PHYSICIAN:  Darlin Priestly, MD   COMPLICATIONS:  None.   INDICATIONS:  Mr. Brassfield is a 77 year old male patient of Dr. Lucila Maine,  Dr. Launa Grill, and Dr. Nanetta Batty with a history of CAD, PVD, atrial  fibrillation, history of hypertension, history of sick sinus syndrome, who  presented on January 17, 2005 to the office to see Dr. Allyson Sabal and was found  to be in complete heart block.  He had been seen by Dr. Lilla Shook on January  13th with EP evaluation and plans to attempt to DC cardioversion.  He did  undergo a right IJ temporary intravenous pacer by Dr. Jacinto Halim.  He is now  brought for permanent pacemaker placement.   DESCRIPTION OF PROCEDURE:  After informed consent, the patient was brought  to the cardiac cath lab, where his left chest was shaved, prepped and draped  in a sterile fashion.  ECG monitor established.  Lidocaine 1% was used to  anesthetize the left subclavian area.  Next, an approximately 3 cm  horizontal infraclavicular incision was then carried out, and hemostasis was  obtained with electrocautery.  Blunt dissection was used to carry this down  to the left pectoral fascia.  Again, hemostasis was obtained with  electrocautery.  Next, reflection of a 3 x 4 cm pocket was then created over  the left pectoral fascia.  The left subclavian vein was then easily entered,  and a J-wire was then easily advanced into the SCV and right atrium.  A 9  French dilator and sheath  were then tracked over the __________  guidewire,  and the guidewire and dilator were then removed.  Following this, a  Medtronic 58 cm passive lead, model #5092, serial number [id number] was then  passed into the right atrium.  The guidewire was then obtained.  The sheath  was then removed.  A second 9 French dilator and sheath was then passed over  __________  the guidewire.  The dilator and the guidewire were removed.  A  second pass of a 53 cm Medtronic lead, model #5594, serial [id number] was  then passed into the right atrium.  Guidewire was obtained.  The sheaths  were again removed.  The guidewire was then anchored to the sheet with a  hemostat.  A J curve was then placed in the ventricular lead stylet, and the  ventricular lead was allowed to prolapse into the RV outflow tract.  A  straight stylet was then exchanged, and the lead was then allowed to  prolapse into the RV apex, and thresholds were determined.  R waves  were  sensed at 9.4 mV.  Threshold in the ventricle was 0.6 volts and 0.5  milliseconds.  Impedance 518 ohm.  Currents 1.4 milliamps. __________  The  atrial lead was then pulled back and allowed to form in the area of the  right atrial appendage.  It should be noted the patient was in atrial  fibrillation.  We were able to sense P waves up to 1.5 mV.  Impedance was  408 ohm.  The retaining guidewire was then removed.  The temporary right IJ  pacemaker was then removed under fluoro.  These leads were then sutured into  place with two silk sutures per lead.  The pocket was then copiously  irrigated with 1% Kanamycin solution, and hemostasis was again confirmed.  The leads were then connected in a serial fashion to a Medtronic In-Rhythm  P1501DR pacer, serial number [id number].  Head screws are tightened.  Pacing  was confirmed.  A single 2-0 silk suture was then placed in the apex of the  pocket.  The generator leads were delivered into the pocket, and the  generator  was then sutured into place.  The subcutaneous layer was then  closed using running 2-0 Vicryl.  The skin layers were closed using running  4-0 Vicryl.  Steri-Strips were then applied.  Patient was then transferred  to the recovery room in stable condition.   CONCLUSION:  Successful implant of a Medtronic In-Rhythm pacemaker, model  #P1501DR, serial number [id number] with passive atrial and ventricular  leads.      RHM/MEDQ  D:  01/19/2005  T:  01/19/2005  Job:  [id number]   cc:   Nanetta Batty, M.D.  Fax: [phone]   Dr. Lucila Maine

## 2011-05-12 NOTE — Op Note (Signed)
NAME:  Matthew Lloyd, Matthew Lloyd NO.:  [id number]   MEDICAL RECORD NO.:  [id number]          PATIENT TYPE:  INP   LOCATION:  2012                         FACILITY:  [facility]   PHYSICIAN:  Bernette Redbird, M.D.   DATE OF BIRTH:  1926-05-22   DATE OF PROCEDURE:  11/30/2004  DATE OF DISCHARGE:                                 OPERATIVE REPORT   PROCEDURE PERFORMED:  Colonoscopy (ADDENDUM).   This is an addendum to the patient's colonoscopy report from earlier today.  The patient had wide complex tachycardia on his cardiac monitor during his  procedure, without any evidence of clinical instability.  The cecum was not  reached during this colonoscopy.   Dr. Launa Grill and Dr. Daphene Jaeger of cardiology have reviewed the rhythm  strip and believe that the rapid rhythm was actually artifactual in  character and thus we will plan to proceed with a barium enema later today  to clear the cecum and the proximal colon of any gross mass lesions.       RB/MEDQ  D:  11/30/2004  T:  11/30/2004  Job:  [id number]   cc:   Dr. Lucila Maine, [place]   Nanetta Batty, M.D.  Fax: [phone]

## 2011-05-12 NOTE — Cardiovascular Report (Signed)
NAME:  Matthew Lloyd, Matthew Lloyd NO.:  [id number]   MEDICAL RECORD NO.:  [id number]          PATIENT TYPE:  INP   LOCATION:  2038                         FACILITY:  [facility]   PHYSICIAN:  Cristy Hilts. Jacinto Halim, MD       DATE OF BIRTH:  1926/04/23   DATE OF PROCEDURE:  01/17/2005  DATE OF DISCHARGE:  01/20/2005                              CARDIAC CATHETERIZATION   PROCEDURE:  1.  Insertion of right internal jugular central venous catheter.  2.  Placement of a temporary transvenous pacemaker.   INDICATIONS FOR PROCEDURE:  Bradycardia, complete heart block, and  hypertension.   DESCRIPTION OF PROCEDURE:  Under sterile precautions after obtaining  informed consent, the neck and right upper part of the chest was prepped and  draped in a sterile fashion.  Right internal jugular access was obtained  with slight difficulty secondary to short neck and fat neck.  Then after  obtaining the access, the right internal jugular 7.5 French sheath was  introduced without any difficulty.   A balloon-tip temporary transvenous pacemaker was then introduced through  the right internal jugular sheath and right ventricular capture was easily  obtained.  The pacemaker was set at the rate of 80 beats per minute  asynchrous mode at an MA of 5.  Chest x-ray was ordered.  The patient  tolerated the procedure well.  No immediate complications were noted.       JRG/MEDQ  D:  06/20/2005  T:  06/20/2005  Job:  [id number]

## 2011-10-10 LAB — CBC
HCT: 21.4 — ABNORMAL LOW
HCT: 24.9 — ABNORMAL LOW
HCT: 25.4 — ABNORMAL LOW
Hemoglobin: 7.4 — CL
Hemoglobin: 8.5 — ABNORMAL LOW
Hemoglobin: 8.9 — ABNORMAL LOW
MCV: 100.9 — ABNORMAL HIGH
MCV: 103.2 — ABNORMAL HIGH
MCV: 103.8 — ABNORMAL HIGH
Platelets: 216
RBC: 2.06 — ABNORMAL LOW
RBC: 2.38 — ABNORMAL LOW
RBC: 2.55 — ABNORMAL LOW
RDW: 19.7 — ABNORMAL HIGH
WBC: 5.2
WBC: 5.4
WBC: 5.4
WBC: 5.6
WBC: 5.7

## 2011-10-10 LAB — CROSSMATCH: Antibody Screen: NEGATIVE

## 2011-10-10 LAB — ABO/RH: ABO/RH(D): O NEG

## 2011-10-10 LAB — BASIC METABOLIC PANEL
Calcium: 8.8
Chloride: 103
Chloride: 103
Chloride: 104
Creatinine, Ser: 1.29
GFR calc Af Amer: >60
GFR calc Af Amer: >60
GFR calc Af Amer: >60
GFR calc non Af Amer: 54 — ABNORMAL LOW
GFR calc non Af Amer: 56 — ABNORMAL LOW
GFR calc non Af Amer: 60 — ABNORMAL LOW
Glucose, Bld: 110 — ABNORMAL HIGH
Potassium: 3.9
Potassium: 4.1
Potassium: 4.4
Sodium: 136
Sodium: 136
Sodium: 136

## 2011-10-10 LAB — B-NATRIURETIC PEPTIDE (CONVERTED LAB)
Pro B Natriuretic peptide (BNP): 1097 — ABNORMAL HIGH
Pro B Natriuretic peptide (BNP): 783 — ABNORMAL HIGH

## 2011-10-10 LAB — PROTIME-INR
INR: 1.2
INR: 1.3
Prothrombin Time: 15.9 — ABNORMAL HIGH
Prothrombin Time: 17.2 — ABNORMAL HIGH

## 2011-10-11 LAB — RETICULOCYTES
RBC.: 2.58 — ABNORMAL LOW
Retic Ct Pct: 1.1

## 2011-10-11 LAB — BASIC METABOLIC PANEL
BUN: 22
BUN: 22
CO2: 24
CO2: 27
CO2: 27
Calcium: 8.8
Calcium: 8.9
Chloride: 101
Chloride: 101
Creatinine, Ser: 1.03
Creatinine, Ser: 1.11
Creatinine, Ser: 1.38
Creatinine, Ser: 1.63 — ABNORMAL HIGH
GFR calc Af Amer: >60
GFR calc Af Amer: >60
GFR calc non Af Amer: >60
GFR calc non Af Amer: >60
Glucose, Bld: 116 — ABNORMAL HIGH
Glucose, Bld: 127 — ABNORMAL HIGH
Glucose, Bld: 164 — ABNORMAL HIGH
Potassium: 4.9
Potassium: 5.2 — ABNORMAL HIGH
Potassium: 5.4 — ABNORMAL HIGH
Sodium: 135

## 2011-10-11 LAB — DIFFERENTIAL
Basophils Relative: 0
Lymphs Abs: 0.6 — ABNORMAL LOW
Monocytes Absolute: 0.5
Monocytes Relative: 9
Neutro Abs: 4.1
Neutrophils Relative %: 77

## 2011-10-11 LAB — CBC
HCT: 25.5 — ABNORMAL LOW
HCT: 26.3 — ABNORMAL LOW
HCT: 26.9 — ABNORMAL LOW
Hemoglobin: 8.3 — ABNORMAL LOW
Hemoglobin: 8.9 — ABNORMAL LOW
MCHC: 34
MCHC: 34.7
MCHC: 35.1
MCV: 105.6 — ABNORMAL HIGH
MCV: 107 — ABNORMAL HIGH
Platelets: 173
Platelets: 189
Platelets: 193
Platelets: 205
RBC: 2.26 — ABNORMAL LOW
RBC: 2.5 — ABNORMAL LOW
RDW: 17 — ABNORMAL HIGH
RDW: 17.4 — ABNORMAL HIGH
RDW: 17.5 — ABNORMAL HIGH
RDW: 18.1 — ABNORMAL HIGH
WBC: 4.7
WBC: 5.3
WBC: 5.3
WBC: 5.7
WBC: 5.7

## 2011-10-11 LAB — COMPREHENSIVE METABOLIC PANEL
ALT: 14
AST: 20
Albumin: 2.8 — ABNORMAL LOW
Albumin: 3.1 — ABNORMAL LOW
Alkaline Phosphatase: 28 — ABNORMAL LOW
Alkaline Phosphatase: 33 — ABNORMAL LOW
BUN: 20
Calcium: 8.6
GFR calc Af Amer: >60
Potassium: 4.2
Potassium: 5.2 — ABNORMAL HIGH
Sodium: 132 — ABNORMAL LOW
Total Protein: 5.4 — ABNORMAL LOW
Total Protein: 5.7 — ABNORMAL LOW

## 2011-10-11 LAB — CK TOTAL AND CKMB (NOT AT ARMC)
CK, MB: 0.6
CK, MB: 0.6
Relative Index: INVALID
Relative Index: INVALID
Total CK: 23
Total CK: 27

## 2011-10-11 LAB — URINALYSIS, ROUTINE W REFLEX MICROSCOPIC
Bilirubin Urine: NEGATIVE
Hgb urine dipstick: NEGATIVE
Ketones, ur: NEGATIVE
Nitrite: NEGATIVE
Protein, ur: NEGATIVE
Specific Gravity, Urine: 1.01
Urobilinogen, UA: 0.2

## 2011-10-11 LAB — B-NATRIURETIC PEPTIDE (CONVERTED LAB)
Pro B Natriuretic peptide (BNP): 679 — ABNORMAL HIGH
Pro B Natriuretic peptide (BNP): 732 — ABNORMAL HIGH
Pro B Natriuretic peptide (BNP): 895 — ABNORMAL HIGH

## 2011-10-11 LAB — ETHANOL: Alcohol, Ethyl (B): <5

## 2011-10-11 LAB — IRON AND TIBC
Iron: 54
Saturation Ratios: 21
TIBC: 256

## 2011-10-11 LAB — PROTIME-INR
INR: 1.4
INR: 2.5 — ABNORMAL HIGH
Prothrombin Time: 17.1 — ABNORMAL HIGH
Prothrombin Time: 28.8 — ABNORMAL HIGH

## 2011-10-11 LAB — TROPONIN I
Troponin I: 0.02
Troponin I: 0.02

## 2011-10-11 LAB — PREALBUMIN: Prealbumin: 17.1 — ABNORMAL LOW

## 2011-10-11 LAB — DIGOXIN LEVEL
Digoxin Level: 1
Digoxin Level: 1.7

## 2011-10-11 LAB — TSH: TSH: 0.996

## 2011-11-28 ENCOUNTER — Other Ambulatory Visit: Payer: Self-pay | Admitting: Cardiovascular Disease

## 2011-11-28 MED ORDER — CEFAZOLIN SODIUM-DEXTROSE 2-3 GM-% IV SOLR
2.0000 g | INTRAVENOUS | Status: DC
  Filled 2011-11-28: qty 50

## 2011-11-28 MED ORDER — SODIUM CHLORIDE 0.9 % IR SOLN
80.0000 mg | Status: DC
  Filled 2011-11-28: qty 2

## 2011-11-29 ENCOUNTER — Ambulatory Visit (HOSPITAL_COMMUNITY)
Admission: RE | Admit: 2011-11-29 | Discharge: 2011-11-29 | Disposition: A | Discharge status: Skilled Nursing Facility | Payer: Medicare Other | Source: Ambulatory Visit | Attending: Cardiovascular Disease | Admitting: Cardiovascular Disease

## 2011-11-29 ENCOUNTER — Encounter (HOSPITAL_COMMUNITY)
Admission: RE | Disposition: A | Discharge status: Skilled Nursing Facility | Payer: Self-pay | Source: Ambulatory Visit | Attending: Cardiovascular Disease

## 2011-11-29 DIAGNOSIS — Z45010 Encounter for checking and testing of cardiac pacemaker pulse generator [battery]: Secondary | ICD-10-CM

## 2011-11-29 DIAGNOSIS — I4891 Unspecified atrial fibrillation: Secondary | ICD-10-CM | POA: Diagnosis present

## 2011-11-29 DIAGNOSIS — Z45018 Encounter for adjustment and management of other part of cardiac pacemaker: Secondary | ICD-10-CM | POA: Insufficient documentation

## 2011-11-29 HISTORY — PX: PACEMAKER GENERATOR CHANGE: SHX5481

## 2011-11-29 LAB — SURGICAL PCR SCREEN: MRSA, PCR: NEGATIVE

## 2011-11-29 LAB — GLUCOSE, CAPILLARY: Glucose-Capillary: 59 mg/dL — ABNORMAL LOW (ref 70–99)

## 2011-11-29 SURGERY — PACEMAKER GENERATOR CHANGE
Anesthesia: LOCAL

## 2011-11-29 MED ORDER — LIDOCAINE HCL (PF) 1 % IJ SOLN
INTRAMUSCULAR | Status: AC
  Filled 2011-11-29: qty 60

## 2011-11-29 MED ORDER — SODIUM CHLORIDE 0.9 % IV SOLN: INTRAVENOUS | Status: DC

## 2011-11-29 MED ORDER — DEXTROSE-NACL 5-0.9 % IV SOLN: INTRAVENOUS | Status: DC

## 2011-11-29 MED ORDER — CHLORHEXIDINE GLUCONATE 4 % EX LIQD
60.0000 mL | Freq: Once | CUTANEOUS | Status: DC
  Filled 2011-11-29: qty 60

## 2011-11-29 MED ORDER — SODIUM CHLORIDE 0.9 % IR SOLN
Freq: Once | Status: DC
  Filled 2011-11-29: qty 2

## 2011-11-29 MED ORDER — CEFAZOLIN SODIUM 1-5 GM-% IV SOLN
INTRAVENOUS | Status: AC
  Filled 2011-11-29: qty 50

## 2011-11-29 MED ORDER — HEPARIN (PORCINE) IN NACL 2-0.9 UNIT/ML-% IJ SOLN
INTRAMUSCULAR | Status: AC
  Filled 2011-11-29: qty 1000

## 2011-11-29 MED ORDER — SODIUM CHLORIDE 0.45 % IV SOLN: INTRAVENOUS | Status: DC

## 2011-11-29 MED ORDER — SODIUM CHLORIDE 0.9 % IR SOLN: Freq: Once | Status: DC

## 2011-11-29 MED ORDER — DEXTROSE-NACL 5-0.9 % IV SOLN
INTRAVENOUS | Status: DC
  Administered 2011-11-29: 08:00:00 via INTRAVENOUS

## 2011-11-29 MED ORDER — MUPIROCIN 2 % EX OINT
TOPICAL_OINTMENT | Freq: Two times a day (BID) | CUTANEOUS | Status: DC
  Administered 2011-11-29: 1 via NASAL
  Filled 2011-11-29 (×2): qty 22

## 2011-11-29 NOTE — H&P (Signed)
History reviewed, patient examined, no change in status, stable for surgery. Blood glucose 59, will use D5NS IV fluids.  Thurmon Fair, MD, [facility] [phone] 7:53 AM

## 2011-11-29 NOTE — Progress Notes (Signed)
Dr Royann Shivers notified of cbg 67 and per Dr Royann Shivers to feed patient and give him some juice

## 2011-11-29 NOTE — Procedures (Signed)
Procedure report  Procedure performed:  1. Pacemaker generator changeout; removal of dual-chamber generator; capping of the atrial lead   Reason for procedure:  1. Device generator at elective replacement interval  2. Permanent atrial fibrillation Procedure performed by:  Thurmon Fair, MD  Complications:  None  Estimated blood loss:  <5 mL  Medications administered during procedure:  Ancef 1g intravenously,  lidocaine 1% 20 mL locally,   Device details:  [employer] [place] model number [id number], serial number [id number] Right ventricular lead (chronic)  Medtronic, model number [id number], serial number LET [id number] (implanted general January 19 2005) Explanted generator Medtronic [place],  model number P1501 DR, serial number  [id number] (implanted 01/19/2005)  Procedure details:  After the risks and benefits of the procedure were discussed the patient provided informed consent. She was brought to the cardiac catheter lab in the fasting state. The patient was prepped and draped in usual sterile fashion. Local anesthesia with 1% lidocaine was administered to to the left infraclavicular area. A 5-6cm horizontal incision was made parallel with and 2-3 cm caudal to the left clavicle, in the area of an old scar. An older scar was seen closer to the left clavicle. Using minimal electrocautery and mostly sharp and blunt dissection the prepectoral pocket was opened carefully to avoid injury to thet loops of chronic leads. Extensive dissection was not necessary. The device was explanted. The pocket was carefully inspected for hemostasis and flushed with copious amounts of antibiotic solution.  The leads were disconnected from the old generator and testing of the lead parameters later showed excellent values. There was underlying atrial fibrillation with slow ventricular response with a rate of approximately 55 beats per minute and occasional pauses greater than 2 seconds. The new generator  was connected to the chronic ventricular lead, with appropriate pacing noted. The atrial lead was Capped.  The entire system was then carefully inserted in the pocket with care been taking that the leads and device assumed a comfortable position without pressure on the incision. Great care was taken that the leads be located deep to the generator. The pocket was then closed in layers using 2 layers of 2-0 Vicryl and cutaneous staples after which a sterile dressing was applied.   At the end of the procedure the following lead parameters were encountered:   Right ventricular lead sensed R waves  10.3 mV, impedance 594 ohms, threshold 0.5 at 0.5 ms pulse width.  Thurmon Fair, MD, [facility] [phone] 10:00 AM

## 2011-11-30 LAB — GLUCOSE, CAPILLARY: Glucose-Capillary: 151 mg/dL — ABNORMAL HIGH (ref 70–99)

## 2013-06-20 ENCOUNTER — Other Ambulatory Visit: Payer: Self-pay | Admitting: Cardiovascular Disease

## 2013-06-20 DIAGNOSIS — I4891 Unspecified atrial fibrillation: Secondary | ICD-10-CM

## 2013-06-24 DEATH — deceased

## 2013-07-08 ENCOUNTER — Encounter: Payer: Self-pay | Admitting: *Deleted

## 2013-07-08 LAB — PACEMAKER DEVICE OBSERVATION
BRDY-0002RV: 70 {beats}/min
BRDY-0004RV: 130 {beats}/min
RV LEAD AMPLITUDE: 4 mv
RV LEAD THRESHOLD: 0.5 V

## 2013-10-20 ENCOUNTER — Encounter: Payer: Self-pay | Admitting: Cardiovascular Disease

## 2014-05-25 ENCOUNTER — Encounter: Payer: Self-pay | Admitting: Cardiology

## 2014-05-26 ENCOUNTER — Encounter: Payer: Self-pay | Admitting: Cardiovascular Disease

## 2014-05-26 ENCOUNTER — Telehealth: Payer: Self-pay | Admitting: Cardiovascular Disease

## 2014-05-26 NOTE — Telephone Encounter (Signed)
Past due letter sent certified 05-26-14/mt

## 2014-12-02 ENCOUNTER — Encounter (HOSPITAL_COMMUNITY): Payer: Self-pay | Admitting: Cardiovascular Disease

## 2015-09-22 ENCOUNTER — Encounter: Payer: Self-pay | Admitting: Cardiovascular Disease
